# Patient Record
Sex: Male | Born: 1935 | Race: White | Hispanic: No | Marital: Single | Smoking: Former smoker
Health system: Southern US, Community
[De-identification: ages and names within clinical notes are randomized; demographics above are authoritative.]

## PROBLEM LIST (undated history)

## (undated) DIAGNOSIS — E785 Hyperlipidemia, unspecified: Secondary | ICD-10-CM

## (undated) DIAGNOSIS — M109 Gout, unspecified: Secondary | ICD-10-CM

## (undated) DIAGNOSIS — M1990 Unspecified osteoarthritis, unspecified site: Secondary | ICD-10-CM

## (undated) DIAGNOSIS — I10 Essential (primary) hypertension: Secondary | ICD-10-CM

## (undated) DIAGNOSIS — E119 Type 2 diabetes mellitus without complications: Secondary | ICD-10-CM

## (undated) HISTORY — DX: Essential (primary) hypertension: I10

## (undated) HISTORY — DX: Hyperlipidemia, unspecified: E78.5

## (undated) HISTORY — DX: Gout, unspecified: M10.9

## (undated) HISTORY — PX: BACK SURGERY: SHX140

## (undated) HISTORY — DX: Type 2 diabetes mellitus without complications: E11.9

## (undated) HISTORY — DX: Unspecified osteoarthritis, unspecified site: M19.90

## (undated) HISTORY — PX: MELANOMA EXCISION: SHX5266

---

## 2001-02-27 ENCOUNTER — Encounter: Payer: Self-pay | Admitting: Emergency Medicine

## 2001-02-27 ENCOUNTER — Emergency Department (HOSPITAL_COMMUNITY): Admission: EM | Admit: 2001-02-27 | Discharge: 2001-02-27 | Payer: Self-pay | Admitting: Emergency Medicine

## 2001-03-05 ENCOUNTER — Encounter: Payer: Self-pay | Admitting: Internal Medicine

## 2001-03-05 ENCOUNTER — Ambulatory Visit (HOSPITAL_COMMUNITY): Admission: RE | Admit: 2001-03-05 | Discharge: 2001-03-05 | Payer: Self-pay | Admitting: Internal Medicine

## 2004-12-19 ENCOUNTER — Ambulatory Visit (HOSPITAL_COMMUNITY): Admission: RE | Admit: 2004-12-19 | Discharge: 2004-12-19 | Payer: Self-pay | Admitting: Urology

## 2006-06-25 ENCOUNTER — Ambulatory Visit (HOSPITAL_COMMUNITY): Admission: RE | Admit: 2006-06-25 | Discharge: 2006-06-25 | Payer: Self-pay | Admitting: General Surgery

## 2007-12-07 ENCOUNTER — Ambulatory Visit (HOSPITAL_COMMUNITY): Admission: RE | Admit: 2007-12-07 | Discharge: 2007-12-07 | Payer: Self-pay | Admitting: Internal Medicine

## 2010-05-09 ENCOUNTER — Ambulatory Visit (HOSPITAL_COMMUNITY): Admission: RE | Admit: 2010-05-09 | Discharge: 2010-05-09 | Payer: Self-pay | Admitting: Internal Medicine

## 2010-12-01 ENCOUNTER — Encounter: Payer: Self-pay | Admitting: Internal Medicine

## 2011-03-28 NOTE — H&P (Signed)
NAME:  VALIN, MASSIE NO.:  [id number]   MEDICAL RECORD NO.:  [id number]          PATIENT TYPE:  PAMB   LOCATION:  DAY                           FACILITY:  [facility]   PHYSICIAN:  Dalia Heading, M.D.  DATE OF BIRTH:  06/04/1934   DATE OF ADMISSION:  DATE OF DISCHARGE:  LH                                HISTORY & PHYSICAL   CHIEF COMPLAINT:  Need for screening colonoscopy.   HISTORY OF PRESENT ILLNESS:  This patient is a 77 year old white male who is  referred for funduscopic evaluation.  He needs a colonoscopy for screening  purposes.  No abdominal pain, weight loss, nausea, vomiting, diarrhea,  constipation, melena, hematochezia have been noted.  He has never had a  colonoscopy.  There is no family history of colon carcinoma.   PAST MEDICAL HISTORY:  Includes gout.   PAST SURGICAL HISTORY:  Laminectomy, umbilical herniorrhaphy.   CURRENT MEDICATIONS:  Allopurinol.   ALLERGIES:  NO KNOWN DRUG ALLERGIES.   REVIEW OF SYSTEMS:  Noncontributory.   PHYSICAL EXAMINATION:  GENERAL:  Patient is a well-developed, well-nourished  white male in no acute distress.  LUNGS:  Clear to auscultation with equal breath sounds bilaterally.  HEART:  Examination reveals a regular rate and rhythm without S3, S4, or  murmurs.  ABDOMEN:  Soft, nontender, nondistended.  RECTAL:  Deferred due to procedure.   IMPRESSION:  Need for screening colonoscopy .   PLAN:  The patient is scheduled for a colonoscopy on 06/25/2006.  The risks  and benefits of the procedure including bleeding and perforation were fully  explained to the patient, gave informed consent.      Dalia Heading, M.D.  Electronically Signed     MAJ/MEDQ  D:  06/18/2006  T:  06/18/2006  Job:  [id number]   cc:   Kingsley Callander. Ouida Sills, MD  Fax: [phone]   Cristi Loron, M.D.  Fax: [phone]

## 2011-03-28 NOTE — Op Note (Signed)
NAME:  SHAHEEN, MENDE              ACCOUNT NO.:  [id number]   MEDICAL RECORD NO.:  [id number]          PATIENT TYPE:  AMB   LOCATION:  DAY                           FACILITY:  [facility]   PHYSICIAN:  Dennie Maizes, M.D.   DATE OF BIRTH:  1934-06-15   DATE OF PROCEDURE:  12/19/2004  DATE OF DISCHARGE:                                 OPERATIVE REPORT   PREOPERATIVE DIAGNOSIS:  Recurrent balanitis, phimosis.   POSTOPERATIVE DIAGNOSIS:  Recurrent balanitis, phimosis.   OPERATIVE PROCEDURE:  Circumcision.   ANESTHESIA:  General.   SURGEON:  Dr. Rito Ehrlich.   COMPLICATIONS:  None.   ESTIMATED BLOOD LOSS:  Minimal.   SPECIMEN:  Foreskin.   INDICATIONS FOR PROCEDURE:  This 77 year old male has a history of recurrent  balanitis with phimosis. He was taken to the OR today for circumcision under  anesthesia.   DESCRIPTION OF PROCEDURE:  General anesthesia was induced, and the patient  was placed on the OR table in the supine position. The lower abdomen and  genitalia were prepped and draped in a sterile fashion. The foreskin was  then clamped in 6 and 12 o'clock positions with straight hemostats. Dorsal  and ventral slits were made and two lateral skin flaps were raised. The  redundant foreskin was excised. The bleeding was controlled with  cauterization.  The edges of the foreskin were then approximated using 4-0 chromic gut.  Vaseline gauze dressing and Coban were applied to the penis. The estimated  blood loss was minimal. The patient was transferred to the PACU in  satisfactory condition.      SK/MEDQ  D:  12/19/2004  T:  12/19/2004  Job:  [id number]   cc:   Kingsley Callander. Ouida Sills, MD  [address]  [place]  [place] [address]  Fax: [phone]

## 2011-03-28 NOTE — H&P (Signed)
NAME:  Dustin Foster, Dustin Foster              ACCOUNT NO.:  [id number]   MEDICAL RECORD NO.:  [id number]          PATIENT TYPE:  AMB   LOCATION:  DAY                           FACILITY:  [facility]   PHYSICIAN:  Dennie Maizes, M.D.   DATE OF BIRTH:  1934-06-30   DATE OF ADMISSION:  12/19/2004  DATE OF DISCHARGE:  LH                                HISTORY & PHYSICAL   CHIEF COMPLAINT:  Recurrent inflammations of the foreskin, redness of the  foreskin and tight foreskin.   HISTORY OF PRESENT ILLNESS:  This 77 year old male was referred to me by Dr.  Ouida Sills.  He had been having recurrent episodes of inflammation of the  foreskin and erythema.  The foreskin is tight.  After retraction of the  foreskin and after sexual intercourse, he has cracking of the foreskin.  He  has used topical antifungal ointments.  He has had recurrence of the problem  for several months.  He denied having any voiding difficulty, dysuria or  gross hematuria.  There is no past history of urolithiasis.  He has urinary  frequency x 4-5 and nocturia x 2.   PAST MEDICAL HISTORY:  1.  History of hypertension.  2.  Borderline diabetes mellitus.  3.  Gout.   There is no evidence of an cardiac disease.   PAST SURGICAL HISTORY:  1.  Status post lumbar laminectomies x 2.  2.  Status post umbilical hernia repair.   MEDICATIONS:  1.  Lisinopril 40 mg one p.o. daily.  2.  Allopurinol 300 mg one p.o. daily.   ALLERGIES:  None.   FAMILY HISTORY:  Unremarkable.   PHYSICAL EXAMINATION:  VITAL SIGNS:  Temperature 98.1 degrees Fahrenheit.  HEIGHT:  5 feet 11 inches.  WEIGHT:  265 pounds.  HEENT:  Normal.  NECK:  No masses.  LUNGS:  Clear to auscultation.  HEART:  Regular rate and rhythm.  No murmurs.  ABDOMEN:  Soft.  No palpable flank mass.  No CVA tenderness.  Bladder not  palpable.  GENITALIA:  Penis with recurrent balanitis with phimosis noted.  The testes  are normal.  RECTAL:  A 40 g size, benign prostate.   IMPRESSION:   Recurrent balanitis and phimosis.   PLAN:  I have discussed with the patient regarding the management options.  He is scheduled to undergo circumcision under anesthesia at the short stay  center.  I have discussed with him regarding the diagnosis, operative  details, alternative treatments, outcome and possible risks and  complications and he has agreed for the procedure to be done.      SK/MEDQ  D:  12/18/2004  T:  12/18/2004  Job:  [id number]   cc:   [facility]  Fax: [phone]   Kingsley Callander. Ouida Sills, MD  [address]  [place]  [place] [address]  Fax: [phone]

## 2011-10-30 ENCOUNTER — Ambulatory Visit (HOSPITAL_COMMUNITY)
Admission: RE | Admit: 2011-10-30 | Discharge: 2011-10-30 | Disposition: A | Discharge status: Home / Self Care | Payer: No Typology Code available for payment source | Source: Ambulatory Visit | Attending: Internal Medicine | Admitting: Internal Medicine

## 2011-10-30 ENCOUNTER — Other Ambulatory Visit (HOSPITAL_COMMUNITY): Payer: Self-pay | Admitting: Internal Medicine

## 2011-10-30 DIAGNOSIS — R52 Pain, unspecified: Secondary | ICD-10-CM

## 2011-10-30 DIAGNOSIS — M542 Cervicalgia: Secondary | ICD-10-CM | POA: Insufficient documentation

## 2011-11-25 DIAGNOSIS — D485 Neoplasm of uncertain behavior of skin: Secondary | ICD-10-CM | POA: Diagnosis not present

## 2011-11-25 DIAGNOSIS — D235 Other benign neoplasm of skin of trunk: Secondary | ICD-10-CM | POA: Diagnosis not present

## 2011-11-25 DIAGNOSIS — Z85820 Personal history of malignant melanoma of skin: Secondary | ICD-10-CM | POA: Diagnosis not present

## 2011-12-12 DIAGNOSIS — Z79899 Other long term (current) drug therapy: Secondary | ICD-10-CM | POA: Diagnosis not present

## 2011-12-12 DIAGNOSIS — M109 Gout, unspecified: Secondary | ICD-10-CM | POA: Diagnosis not present

## 2011-12-12 DIAGNOSIS — E119 Type 2 diabetes mellitus without complications: Secondary | ICD-10-CM | POA: Diagnosis not present

## 2011-12-12 DIAGNOSIS — I10 Essential (primary) hypertension: Secondary | ICD-10-CM | POA: Diagnosis not present

## 2011-12-19 DIAGNOSIS — I10 Essential (primary) hypertension: Secondary | ICD-10-CM | POA: Diagnosis not present

## 2011-12-19 DIAGNOSIS — R42 Dizziness and giddiness: Secondary | ICD-10-CM | POA: Diagnosis not present

## 2012-02-05 DIAGNOSIS — H2510 Age-related nuclear cataract, unspecified eye: Secondary | ICD-10-CM | POA: Diagnosis not present

## 2012-02-24 DIAGNOSIS — D485 Neoplasm of uncertain behavior of skin: Secondary | ICD-10-CM | POA: Diagnosis not present

## 2012-02-24 DIAGNOSIS — D2370 Other benign neoplasm of skin of unspecified lower limb, including hip: Secondary | ICD-10-CM | POA: Diagnosis not present

## 2012-02-24 DIAGNOSIS — Z85820 Personal history of malignant melanoma of skin: Secondary | ICD-10-CM | POA: Diagnosis not present

## 2012-02-24 DIAGNOSIS — D235 Other benign neoplasm of skin of trunk: Secondary | ICD-10-CM | POA: Diagnosis not present

## 2012-05-18 DIAGNOSIS — D235 Other benign neoplasm of skin of trunk: Secondary | ICD-10-CM | POA: Diagnosis not present

## 2012-05-18 DIAGNOSIS — L570 Actinic keratosis: Secondary | ICD-10-CM | POA: Diagnosis not present

## 2012-05-18 DIAGNOSIS — Z85820 Personal history of malignant melanoma of skin: Secondary | ICD-10-CM | POA: Diagnosis not present

## 2012-05-29 IMAGING — CR DG CERVICAL SPINE COMPLETE 4+V
6 series · 6 of 6 positions shown · non-contrast
Comparison: 05/09/2010.

CLINICAL DATA: 77-year-old male with pain.

CERVICAL SPINE - COMPLETE 4+ VIEW

[view not recorded (1 of 6)]
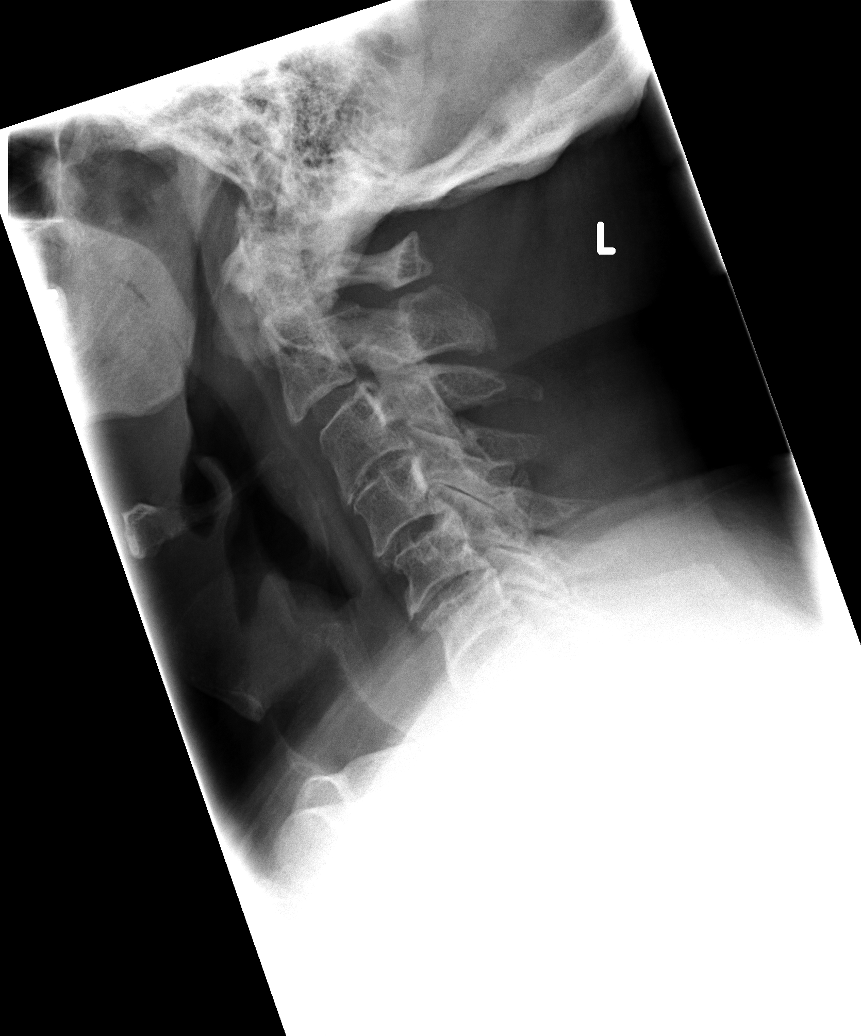

[view not recorded (2 of 6)]
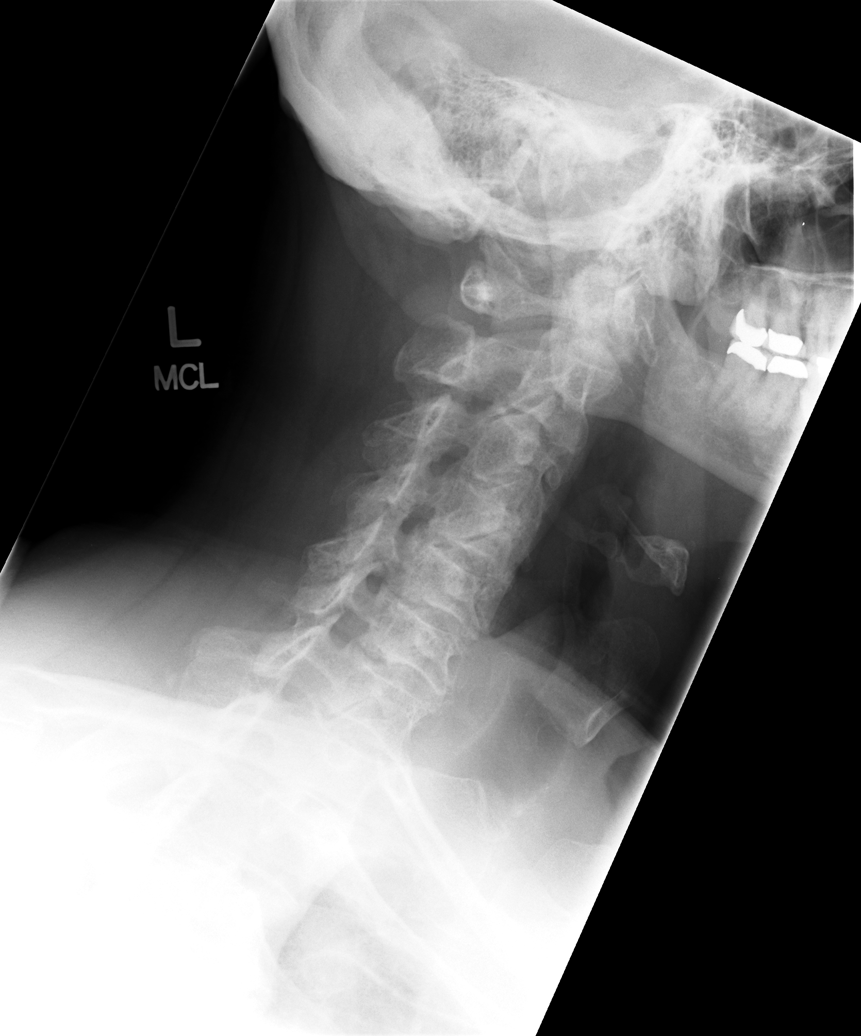

[view not recorded (3 of 6)]
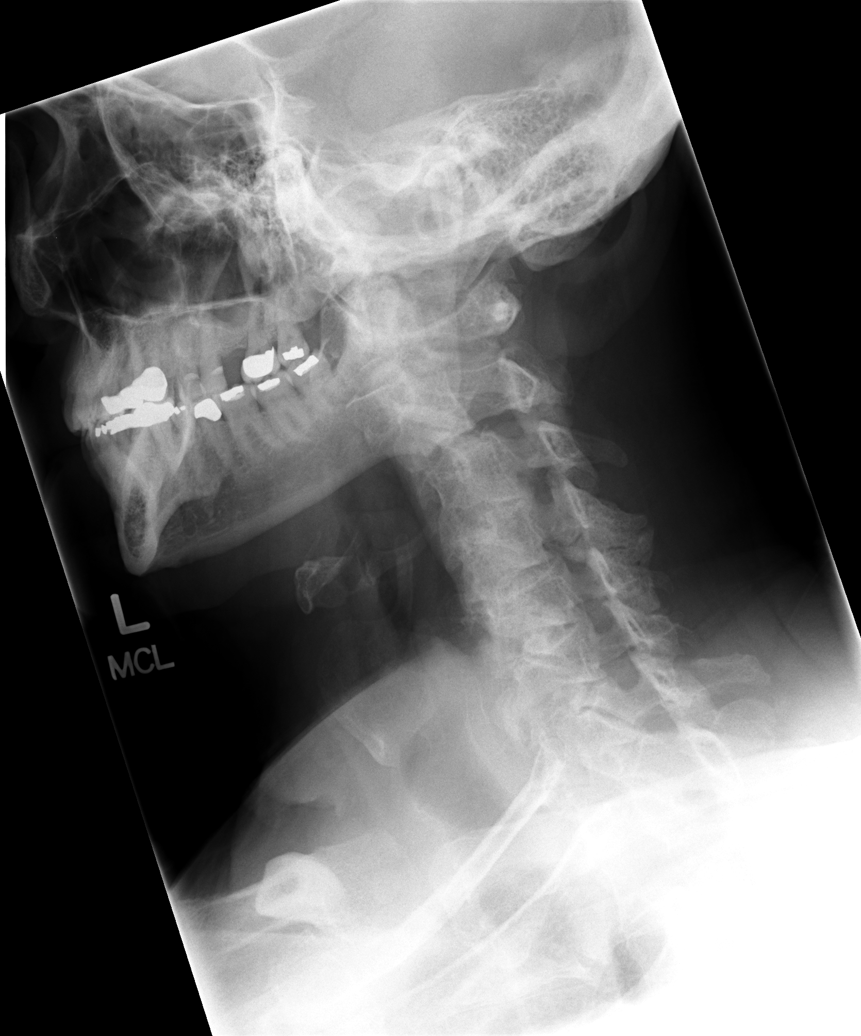

[view not recorded (4 of 6)]
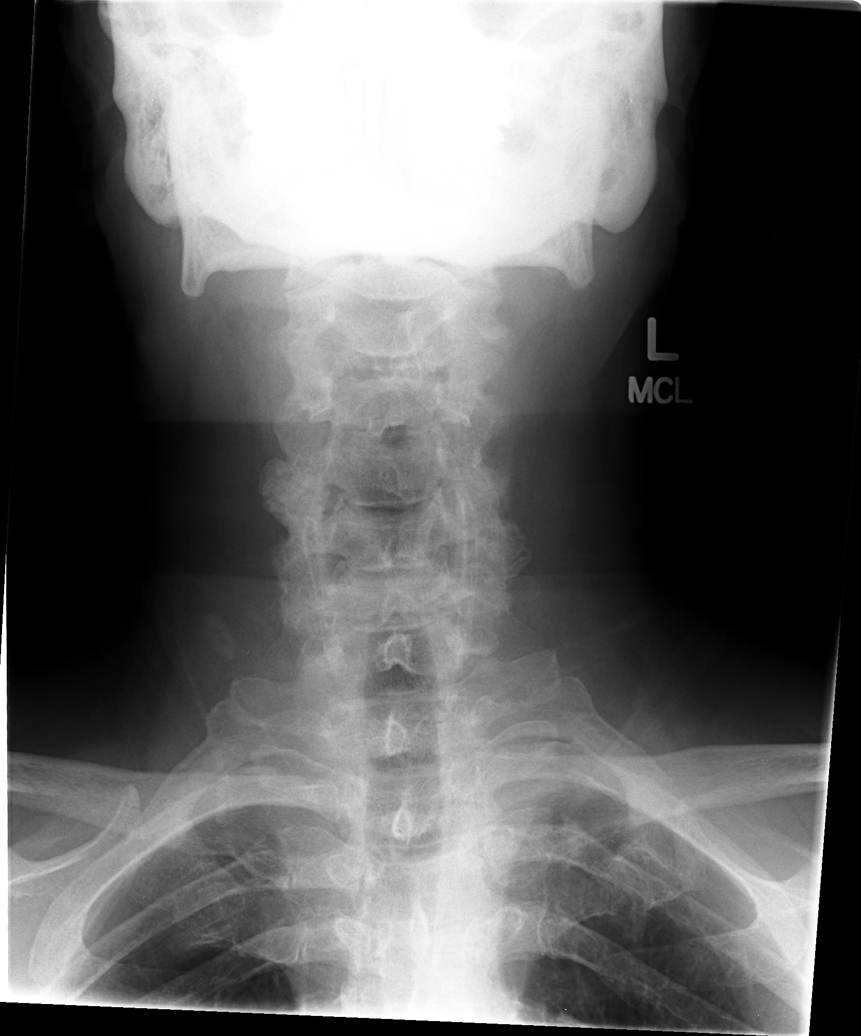

[view not recorded (5 of 6)]
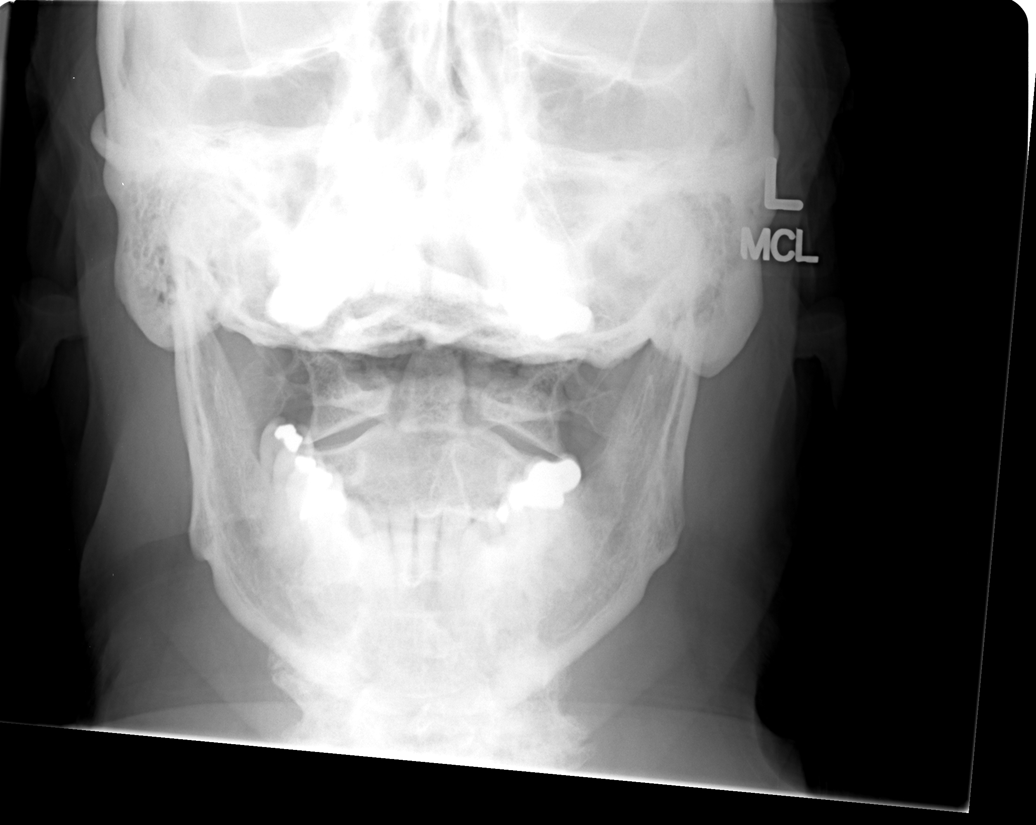

[view not recorded (6 of 6)]
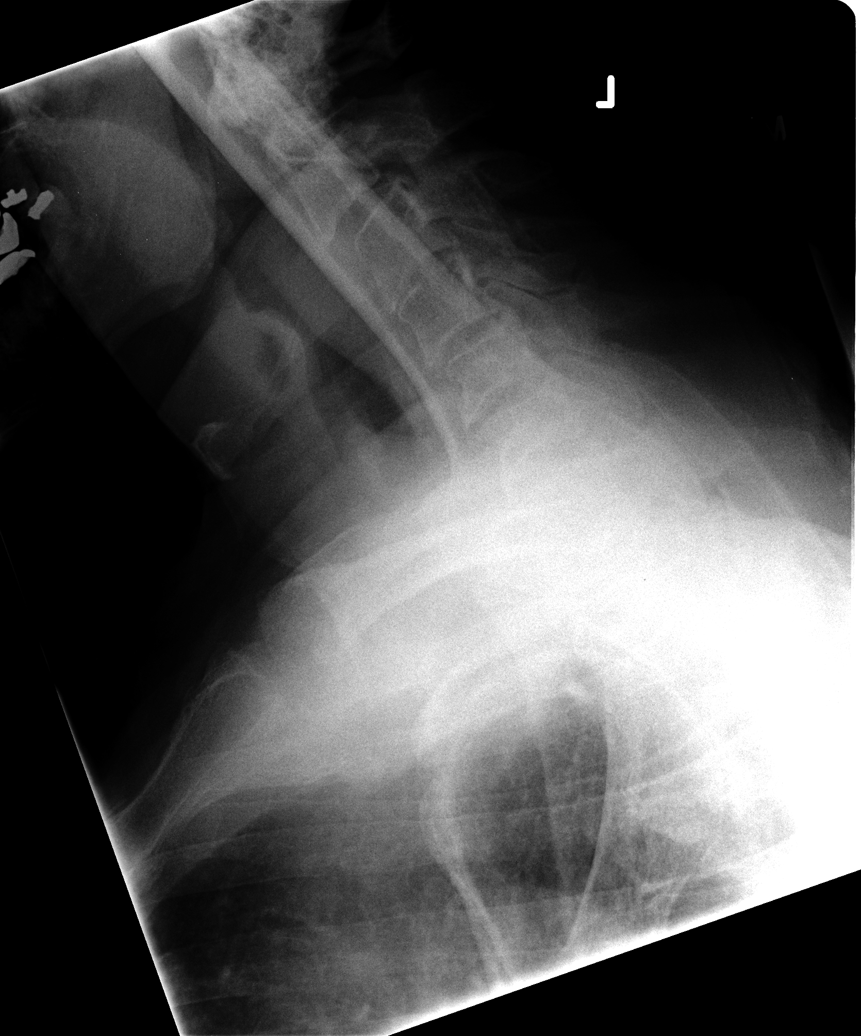

[6 of 6 positions shown; findings below may reference images not displayed]

FINDINGS: Straightening of cervical lordosis is chronic.  Normal
prevertebral soft tissues. Bilateral posterior element alignment is
within normal limits.  Severe facet hypertrophy at C4-C5 and C5-C6.
Severe osseous neural foraminal stenosis at the left C5 neural
foramen. Cervicothoracic junction alignment is within normal
limits.  Lung apices are within normal limits.  C1 and C2 alignment
within normal limits.
IMPRESSION: Chronic cervical degenerative changes, especially severe facet
hypertrophy at C4-C5 and C5-C6.

## 2012-06-21 DIAGNOSIS — R7301 Impaired fasting glucose: Secondary | ICD-10-CM | POA: Diagnosis not present

## 2012-06-21 DIAGNOSIS — I10 Essential (primary) hypertension: Secondary | ICD-10-CM | POA: Diagnosis not present

## 2012-08-16 DIAGNOSIS — D235 Other benign neoplasm of skin of trunk: Secondary | ICD-10-CM | POA: Diagnosis not present

## 2012-08-16 DIAGNOSIS — Z85820 Personal history of malignant melanoma of skin: Secondary | ICD-10-CM | POA: Diagnosis not present

## 2012-08-16 DIAGNOSIS — L570 Actinic keratosis: Secondary | ICD-10-CM | POA: Diagnosis not present

## 2012-08-16 DIAGNOSIS — L821 Other seborrheic keratosis: Secondary | ICD-10-CM | POA: Diagnosis not present

## 2012-08-30 DIAGNOSIS — Z23 Encounter for immunization: Secondary | ICD-10-CM | POA: Diagnosis not present

## 2012-11-16 DIAGNOSIS — D235 Other benign neoplasm of skin of trunk: Secondary | ICD-10-CM | POA: Diagnosis not present

## 2012-11-16 DIAGNOSIS — Z85820 Personal history of malignant melanoma of skin: Secondary | ICD-10-CM | POA: Diagnosis not present

## 2012-11-16 DIAGNOSIS — D485 Neoplasm of uncertain behavior of skin: Secondary | ICD-10-CM | POA: Diagnosis not present

## 2012-11-23 DIAGNOSIS — D485 Neoplasm of uncertain behavior of skin: Secondary | ICD-10-CM | POA: Diagnosis not present

## 2012-12-14 DIAGNOSIS — I10 Essential (primary) hypertension: Secondary | ICD-10-CM | POA: Diagnosis not present

## 2012-12-14 DIAGNOSIS — E785 Hyperlipidemia, unspecified: Secondary | ICD-10-CM | POA: Diagnosis not present

## 2012-12-14 DIAGNOSIS — Z79899 Other long term (current) drug therapy: Secondary | ICD-10-CM | POA: Diagnosis not present

## 2012-12-22 DIAGNOSIS — E119 Type 2 diabetes mellitus without complications: Secondary | ICD-10-CM | POA: Diagnosis not present

## 2012-12-22 DIAGNOSIS — I10 Essential (primary) hypertension: Secondary | ICD-10-CM | POA: Diagnosis not present

## 2013-02-08 DIAGNOSIS — H2510 Age-related nuclear cataract, unspecified eye: Secondary | ICD-10-CM | POA: Diagnosis not present

## 2013-02-14 DIAGNOSIS — L821 Other seborrheic keratosis: Secondary | ICD-10-CM | POA: Diagnosis not present

## 2013-02-14 DIAGNOSIS — Z85820 Personal history of malignant melanoma of skin: Secondary | ICD-10-CM | POA: Diagnosis not present

## 2013-02-14 DIAGNOSIS — D235 Other benign neoplasm of skin of trunk: Secondary | ICD-10-CM | POA: Diagnosis not present

## 2013-02-14 DIAGNOSIS — D485 Neoplasm of uncertain behavior of skin: Secondary | ICD-10-CM | POA: Diagnosis not present

## 2013-02-16 DIAGNOSIS — H2510 Age-related nuclear cataract, unspecified eye: Secondary | ICD-10-CM | POA: Diagnosis not present

## 2013-02-21 DIAGNOSIS — H2510 Age-related nuclear cataract, unspecified eye: Secondary | ICD-10-CM | POA: Diagnosis not present

## 2013-02-21 DIAGNOSIS — H25049 Posterior subcapsular polar age-related cataract, unspecified eye: Secondary | ICD-10-CM | POA: Diagnosis not present

## 2013-05-16 DIAGNOSIS — H2510 Age-related nuclear cataract, unspecified eye: Secondary | ICD-10-CM | POA: Diagnosis not present

## 2013-05-16 DIAGNOSIS — H25049 Posterior subcapsular polar age-related cataract, unspecified eye: Secondary | ICD-10-CM | POA: Diagnosis not present

## 2013-05-23 DIAGNOSIS — I781 Nevus, non-neoplastic: Secondary | ICD-10-CM | POA: Diagnosis not present

## 2013-05-23 DIAGNOSIS — Z85820 Personal history of malignant melanoma of skin: Secondary | ICD-10-CM | POA: Diagnosis not present

## 2013-05-23 DIAGNOSIS — D235 Other benign neoplasm of skin of trunk: Secondary | ICD-10-CM | POA: Diagnosis not present

## 2013-05-23 DIAGNOSIS — L821 Other seborrheic keratosis: Secondary | ICD-10-CM | POA: Diagnosis not present

## 2013-06-15 DIAGNOSIS — E119 Type 2 diabetes mellitus without complications: Secondary | ICD-10-CM | POA: Diagnosis not present

## 2013-06-23 DIAGNOSIS — E119 Type 2 diabetes mellitus without complications: Secondary | ICD-10-CM | POA: Diagnosis not present

## 2013-06-23 DIAGNOSIS — I10 Essential (primary) hypertension: Secondary | ICD-10-CM | POA: Diagnosis not present

## 2013-08-16 DIAGNOSIS — Z23 Encounter for immunization: Secondary | ICD-10-CM | POA: Diagnosis not present

## 2013-08-23 DIAGNOSIS — Z85820 Personal history of malignant melanoma of skin: Secondary | ICD-10-CM | POA: Diagnosis not present

## 2013-08-23 DIAGNOSIS — D235 Other benign neoplasm of skin of trunk: Secondary | ICD-10-CM | POA: Diagnosis not present

## 2013-12-30 DIAGNOSIS — E119 Type 2 diabetes mellitus without complications: Secondary | ICD-10-CM | POA: Diagnosis not present

## 2013-12-30 DIAGNOSIS — Z79899 Other long term (current) drug therapy: Secondary | ICD-10-CM | POA: Diagnosis not present

## 2013-12-30 DIAGNOSIS — E785 Hyperlipidemia, unspecified: Secondary | ICD-10-CM | POA: Diagnosis not present

## 2013-12-30 DIAGNOSIS — I10 Essential (primary) hypertension: Secondary | ICD-10-CM | POA: Diagnosis not present

## 2014-01-02 DIAGNOSIS — I10 Essential (primary) hypertension: Secondary | ICD-10-CM | POA: Diagnosis not present

## 2014-01-02 DIAGNOSIS — E119 Type 2 diabetes mellitus without complications: Secondary | ICD-10-CM | POA: Diagnosis not present

## 2014-02-21 DIAGNOSIS — D485 Neoplasm of uncertain behavior of skin: Secondary | ICD-10-CM | POA: Diagnosis not present

## 2014-02-21 DIAGNOSIS — D235 Other benign neoplasm of skin of trunk: Secondary | ICD-10-CM | POA: Diagnosis not present

## 2014-02-21 DIAGNOSIS — L820 Inflamed seborrheic keratosis: Secondary | ICD-10-CM | POA: Diagnosis not present

## 2014-02-21 DIAGNOSIS — Z85820 Personal history of malignant melanoma of skin: Secondary | ICD-10-CM | POA: Diagnosis not present

## 2014-06-29 DIAGNOSIS — E119 Type 2 diabetes mellitus without complications: Secondary | ICD-10-CM | POA: Diagnosis not present

## 2014-06-29 DIAGNOSIS — Z79899 Other long term (current) drug therapy: Secondary | ICD-10-CM | POA: Diagnosis not present

## 2014-06-29 DIAGNOSIS — M109 Gout, unspecified: Secondary | ICD-10-CM | POA: Diagnosis not present

## 2014-07-06 DIAGNOSIS — I10 Essential (primary) hypertension: Secondary | ICD-10-CM | POA: Diagnosis not present

## 2014-07-06 DIAGNOSIS — E119 Type 2 diabetes mellitus without complications: Secondary | ICD-10-CM | POA: Diagnosis not present

## 2014-08-22 DIAGNOSIS — Z961 Presence of intraocular lens: Secondary | ICD-10-CM | POA: Diagnosis not present

## 2014-08-22 DIAGNOSIS — H26492 Other secondary cataract, left eye: Secondary | ICD-10-CM | POA: Diagnosis not present

## 2014-08-23 DIAGNOSIS — L570 Actinic keratosis: Secondary | ICD-10-CM | POA: Diagnosis not present

## 2014-08-23 DIAGNOSIS — X32XXXD Exposure to sunlight, subsequent encounter: Secondary | ICD-10-CM | POA: Diagnosis not present

## 2014-08-23 DIAGNOSIS — Z08 Encounter for follow-up examination after completed treatment for malignant neoplasm: Secondary | ICD-10-CM | POA: Diagnosis not present

## 2014-08-23 DIAGNOSIS — Z85820 Personal history of malignant melanoma of skin: Secondary | ICD-10-CM | POA: Diagnosis not present

## 2014-08-23 DIAGNOSIS — L905 Scar conditions and fibrosis of skin: Secondary | ICD-10-CM | POA: Diagnosis not present

## 2014-08-23 DIAGNOSIS — Z1283 Encounter for screening for malignant neoplasm of skin: Secondary | ICD-10-CM | POA: Diagnosis not present

## 2014-08-24 DIAGNOSIS — Z23 Encounter for immunization: Secondary | ICD-10-CM | POA: Diagnosis not present

## 2015-01-01 DIAGNOSIS — E119 Type 2 diabetes mellitus without complications: Secondary | ICD-10-CM | POA: Diagnosis not present

## 2015-01-01 DIAGNOSIS — Z79899 Other long term (current) drug therapy: Secondary | ICD-10-CM | POA: Diagnosis not present

## 2015-01-01 DIAGNOSIS — E785 Hyperlipidemia, unspecified: Secondary | ICD-10-CM | POA: Diagnosis not present

## 2015-01-01 DIAGNOSIS — I10 Essential (primary) hypertension: Secondary | ICD-10-CM | POA: Diagnosis not present

## 2015-01-08 DIAGNOSIS — I10 Essential (primary) hypertension: Secondary | ICD-10-CM | POA: Diagnosis not present

## 2015-01-08 DIAGNOSIS — Z23 Encounter for immunization: Secondary | ICD-10-CM | POA: Diagnosis not present

## 2015-01-08 DIAGNOSIS — E119 Type 2 diabetes mellitus without complications: Secondary | ICD-10-CM | POA: Diagnosis not present

## 2015-02-21 DIAGNOSIS — Z1283 Encounter for screening for malignant neoplasm of skin: Secondary | ICD-10-CM | POA: Diagnosis not present

## 2015-02-21 DIAGNOSIS — Z08 Encounter for follow-up examination after completed treatment for malignant neoplasm: Secondary | ICD-10-CM | POA: Diagnosis not present

## 2015-02-21 DIAGNOSIS — B070 Plantar wart: Secondary | ICD-10-CM | POA: Diagnosis not present

## 2015-02-21 DIAGNOSIS — Z85820 Personal history of malignant melanoma of skin: Secondary | ICD-10-CM | POA: Diagnosis not present

## 2015-07-02 DIAGNOSIS — E119 Type 2 diabetes mellitus without complications: Secondary | ICD-10-CM | POA: Diagnosis not present

## 2015-07-10 DIAGNOSIS — Z6837 Body mass index (BMI) 37.0-37.9, adult: Secondary | ICD-10-CM | POA: Diagnosis not present

## 2015-07-10 DIAGNOSIS — I10 Essential (primary) hypertension: Secondary | ICD-10-CM | POA: Diagnosis not present

## 2015-07-10 DIAGNOSIS — E119 Type 2 diabetes mellitus without complications: Secondary | ICD-10-CM | POA: Diagnosis not present

## 2015-08-22 DIAGNOSIS — Z85820 Personal history of malignant melanoma of skin: Secondary | ICD-10-CM | POA: Diagnosis not present

## 2015-08-22 DIAGNOSIS — Z08 Encounter for follow-up examination after completed treatment for malignant neoplasm: Secondary | ICD-10-CM | POA: Diagnosis not present

## 2015-08-22 DIAGNOSIS — Z1283 Encounter for screening for malignant neoplasm of skin: Secondary | ICD-10-CM | POA: Diagnosis not present

## 2015-08-22 DIAGNOSIS — Z23 Encounter for immunization: Secondary | ICD-10-CM | POA: Diagnosis not present

## 2015-12-31 DIAGNOSIS — I10 Essential (primary) hypertension: Secondary | ICD-10-CM | POA: Diagnosis not present

## 2016-01-02 DIAGNOSIS — M109 Gout, unspecified: Secondary | ICD-10-CM | POA: Diagnosis not present

## 2016-01-02 DIAGNOSIS — M1990 Unspecified osteoarthritis, unspecified site: Secondary | ICD-10-CM | POA: Diagnosis not present

## 2016-01-02 DIAGNOSIS — E119 Type 2 diabetes mellitus without complications: Secondary | ICD-10-CM | POA: Diagnosis not present

## 2016-01-02 DIAGNOSIS — E785 Hyperlipidemia, unspecified: Secondary | ICD-10-CM | POA: Diagnosis not present

## 2016-01-02 DIAGNOSIS — Z79899 Other long term (current) drug therapy: Secondary | ICD-10-CM | POA: Diagnosis not present

## 2016-01-10 DIAGNOSIS — Z6839 Body mass index (BMI) 39.0-39.9, adult: Secondary | ICD-10-CM | POA: Diagnosis not present

## 2016-01-10 DIAGNOSIS — E119 Type 2 diabetes mellitus without complications: Secondary | ICD-10-CM | POA: Diagnosis not present

## 2016-01-10 DIAGNOSIS — M1000 Idiopathic gout, unspecified site: Secondary | ICD-10-CM | POA: Diagnosis not present

## 2016-01-10 DIAGNOSIS — I10 Essential (primary) hypertension: Secondary | ICD-10-CM | POA: Diagnosis not present

## 2016-02-20 DIAGNOSIS — Z08 Encounter for follow-up examination after completed treatment for malignant neoplasm: Secondary | ICD-10-CM | POA: Diagnosis not present

## 2016-02-20 DIAGNOSIS — X32XXXD Exposure to sunlight, subsequent encounter: Secondary | ICD-10-CM | POA: Diagnosis not present

## 2016-02-20 DIAGNOSIS — Z85820 Personal history of malignant melanoma of skin: Secondary | ICD-10-CM | POA: Diagnosis not present

## 2016-02-20 DIAGNOSIS — Z1283 Encounter for screening for malignant neoplasm of skin: Secondary | ICD-10-CM | POA: Diagnosis not present

## 2016-02-20 DIAGNOSIS — L570 Actinic keratosis: Secondary | ICD-10-CM | POA: Diagnosis not present

## 2016-07-07 DIAGNOSIS — E119 Type 2 diabetes mellitus without complications: Secondary | ICD-10-CM | POA: Diagnosis not present

## 2016-07-15 DIAGNOSIS — I10 Essential (primary) hypertension: Secondary | ICD-10-CM | POA: Diagnosis not present

## 2016-07-15 DIAGNOSIS — E119 Type 2 diabetes mellitus without complications: Secondary | ICD-10-CM | POA: Diagnosis not present

## 2016-08-20 DIAGNOSIS — Z23 Encounter for immunization: Secondary | ICD-10-CM | POA: Diagnosis not present

## 2016-08-20 DIAGNOSIS — Z85820 Personal history of malignant melanoma of skin: Secondary | ICD-10-CM | POA: Diagnosis not present

## 2016-08-20 DIAGNOSIS — D225 Melanocytic nevi of trunk: Secondary | ICD-10-CM | POA: Diagnosis not present

## 2016-08-20 DIAGNOSIS — Z1283 Encounter for screening for malignant neoplasm of skin: Secondary | ICD-10-CM | POA: Diagnosis not present

## 2016-08-20 DIAGNOSIS — Z08 Encounter for follow-up examination after completed treatment for malignant neoplasm: Secondary | ICD-10-CM | POA: Diagnosis not present

## 2016-08-26 DIAGNOSIS — H04123 Dry eye syndrome of bilateral lacrimal glands: Secondary | ICD-10-CM | POA: Diagnosis not present

## 2016-08-26 DIAGNOSIS — H02831 Dermatochalasis of right upper eyelid: Secondary | ICD-10-CM | POA: Diagnosis not present

## 2016-08-26 DIAGNOSIS — Z961 Presence of intraocular lens: Secondary | ICD-10-CM | POA: Diagnosis not present

## 2016-08-26 DIAGNOSIS — H02834 Dermatochalasis of left upper eyelid: Secondary | ICD-10-CM | POA: Diagnosis not present

## 2016-08-26 DIAGNOSIS — D3131 Benign neoplasm of right choroid: Secondary | ICD-10-CM | POA: Diagnosis not present

## 2016-08-26 DIAGNOSIS — E119 Type 2 diabetes mellitus without complications: Secondary | ICD-10-CM | POA: Diagnosis not present

## 2016-11-25 DIAGNOSIS — Z6836 Body mass index (BMI) 36.0-36.9, adult: Secondary | ICD-10-CM | POA: Diagnosis not present

## 2016-11-25 DIAGNOSIS — J189 Pneumonia, unspecified organism: Secondary | ICD-10-CM | POA: Diagnosis not present

## 2017-01-12 DIAGNOSIS — I10 Essential (primary) hypertension: Secondary | ICD-10-CM | POA: Diagnosis not present

## 2017-01-12 DIAGNOSIS — M1990 Unspecified osteoarthritis, unspecified site: Secondary | ICD-10-CM | POA: Diagnosis not present

## 2017-01-12 DIAGNOSIS — E785 Hyperlipidemia, unspecified: Secondary | ICD-10-CM | POA: Diagnosis not present

## 2017-01-12 DIAGNOSIS — Z79899 Other long term (current) drug therapy: Secondary | ICD-10-CM | POA: Diagnosis not present

## 2017-01-12 DIAGNOSIS — E119 Type 2 diabetes mellitus without complications: Secondary | ICD-10-CM | POA: Diagnosis not present

## 2017-01-20 DIAGNOSIS — M1A00X0 Idiopathic chronic gout, unspecified site, without tophus (tophi): Secondary | ICD-10-CM | POA: Diagnosis not present

## 2017-01-20 DIAGNOSIS — I10 Essential (primary) hypertension: Secondary | ICD-10-CM | POA: Diagnosis not present

## 2017-01-20 DIAGNOSIS — E119 Type 2 diabetes mellitus without complications: Secondary | ICD-10-CM | POA: Diagnosis not present

## 2017-01-21 DIAGNOSIS — Z87891 Personal history of nicotine dependence: Secondary | ICD-10-CM | POA: Diagnosis not present

## 2017-01-21 DIAGNOSIS — Z6836 Body mass index (BMI) 36.0-36.9, adult: Secondary | ICD-10-CM | POA: Diagnosis not present

## 2017-01-21 DIAGNOSIS — Z7982 Long term (current) use of aspirin: Secondary | ICD-10-CM | POA: Diagnosis not present

## 2017-01-21 DIAGNOSIS — I10 Essential (primary) hypertension: Secondary | ICD-10-CM | POA: Diagnosis not present

## 2017-01-21 DIAGNOSIS — Z79899 Other long term (current) drug therapy: Secondary | ICD-10-CM | POA: Diagnosis not present

## 2017-01-21 DIAGNOSIS — E119 Type 2 diabetes mellitus without complications: Secondary | ICD-10-CM | POA: Diagnosis not present

## 2017-01-21 DIAGNOSIS — Z0000 Encounter for general adult medical examination without abnormal findings: Secondary | ICD-10-CM | POA: Diagnosis not present

## 2017-01-21 DIAGNOSIS — E669 Obesity, unspecified: Secondary | ICD-10-CM | POA: Diagnosis not present

## 2017-01-21 DIAGNOSIS — M1A9XX0 Chronic gout, unspecified, without tophus (tophi): Secondary | ICD-10-CM | POA: Diagnosis not present

## 2017-07-16 DIAGNOSIS — E119 Type 2 diabetes mellitus without complications: Secondary | ICD-10-CM | POA: Diagnosis not present

## 2017-07-23 DIAGNOSIS — I10 Essential (primary) hypertension: Secondary | ICD-10-CM | POA: Diagnosis not present

## 2017-07-23 DIAGNOSIS — E119 Type 2 diabetes mellitus without complications: Secondary | ICD-10-CM | POA: Diagnosis not present

## 2017-08-24 DIAGNOSIS — Z1283 Encounter for screening for malignant neoplasm of skin: Secondary | ICD-10-CM | POA: Diagnosis not present

## 2017-08-24 DIAGNOSIS — Z85820 Personal history of malignant melanoma of skin: Secondary | ICD-10-CM | POA: Diagnosis not present

## 2017-08-24 DIAGNOSIS — Z08 Encounter for follow-up examination after completed treatment for malignant neoplasm: Secondary | ICD-10-CM | POA: Diagnosis not present

## 2017-09-02 DIAGNOSIS — D3131 Benign neoplasm of right choroid: Secondary | ICD-10-CM | POA: Diagnosis not present

## 2017-09-02 DIAGNOSIS — Z961 Presence of intraocular lens: Secondary | ICD-10-CM | POA: Diagnosis not present

## 2017-09-02 DIAGNOSIS — Z23 Encounter for immunization: Secondary | ICD-10-CM | POA: Diagnosis not present

## 2017-09-02 DIAGNOSIS — H02834 Dermatochalasis of left upper eyelid: Secondary | ICD-10-CM | POA: Diagnosis not present

## 2017-09-02 DIAGNOSIS — H04123 Dry eye syndrome of bilateral lacrimal glands: Secondary | ICD-10-CM | POA: Diagnosis not present

## 2017-09-02 DIAGNOSIS — E119 Type 2 diabetes mellitus without complications: Secondary | ICD-10-CM | POA: Diagnosis not present

## 2017-09-02 DIAGNOSIS — H02831 Dermatochalasis of right upper eyelid: Secondary | ICD-10-CM | POA: Diagnosis not present

## 2017-10-05 DIAGNOSIS — R69 Illness, unspecified: Secondary | ICD-10-CM | POA: Diagnosis not present

## 2017-11-17 DIAGNOSIS — Z23 Encounter for immunization: Secondary | ICD-10-CM | POA: Diagnosis not present

## 2017-11-17 DIAGNOSIS — W540XXA Bitten by dog, initial encounter: Secondary | ICD-10-CM | POA: Diagnosis not present

## 2018-01-21 DIAGNOSIS — Z79899 Other long term (current) drug therapy: Secondary | ICD-10-CM | POA: Diagnosis not present

## 2018-01-21 DIAGNOSIS — M109 Gout, unspecified: Secondary | ICD-10-CM | POA: Diagnosis not present

## 2018-01-21 DIAGNOSIS — I10 Essential (primary) hypertension: Secondary | ICD-10-CM | POA: Diagnosis not present

## 2018-01-21 DIAGNOSIS — E785 Hyperlipidemia, unspecified: Secondary | ICD-10-CM | POA: Diagnosis not present

## 2018-01-21 DIAGNOSIS — E119 Type 2 diabetes mellitus without complications: Secondary | ICD-10-CM | POA: Diagnosis not present

## 2018-01-28 DIAGNOSIS — I10 Essential (primary) hypertension: Secondary | ICD-10-CM | POA: Diagnosis not present

## 2018-01-28 DIAGNOSIS — E119 Type 2 diabetes mellitus without complications: Secondary | ICD-10-CM | POA: Diagnosis not present

## 2018-01-28 DIAGNOSIS — M109 Gout, unspecified: Secondary | ICD-10-CM | POA: Diagnosis not present

## 2018-01-28 DIAGNOSIS — Z6838 Body mass index (BMI) 38.0-38.9, adult: Secondary | ICD-10-CM | POA: Diagnosis not present

## 2018-03-31 DIAGNOSIS — R69 Illness, unspecified: Secondary | ICD-10-CM | POA: Diagnosis not present

## 2018-07-22 DIAGNOSIS — E119 Type 2 diabetes mellitus without complications: Secondary | ICD-10-CM | POA: Diagnosis not present

## 2018-07-29 DIAGNOSIS — N401 Enlarged prostate with lower urinary tract symptoms: Secondary | ICD-10-CM | POA: Diagnosis not present

## 2018-07-29 DIAGNOSIS — I10 Essential (primary) hypertension: Secondary | ICD-10-CM | POA: Diagnosis not present

## 2018-07-29 DIAGNOSIS — E1159 Type 2 diabetes mellitus with other circulatory complications: Secondary | ICD-10-CM | POA: Diagnosis not present

## 2018-09-02 DIAGNOSIS — H02834 Dermatochalasis of left upper eyelid: Secondary | ICD-10-CM | POA: Diagnosis not present

## 2018-09-02 DIAGNOSIS — D3131 Benign neoplasm of right choroid: Secondary | ICD-10-CM | POA: Diagnosis not present

## 2018-09-02 DIAGNOSIS — H04123 Dry eye syndrome of bilateral lacrimal glands: Secondary | ICD-10-CM | POA: Diagnosis not present

## 2018-09-02 DIAGNOSIS — H02831 Dermatochalasis of right upper eyelid: Secondary | ICD-10-CM | POA: Diagnosis not present

## 2018-09-02 DIAGNOSIS — Z961 Presence of intraocular lens: Secondary | ICD-10-CM | POA: Diagnosis not present

## 2018-09-02 DIAGNOSIS — E119 Type 2 diabetes mellitus without complications: Secondary | ICD-10-CM | POA: Diagnosis not present

## 2018-09-14 DIAGNOSIS — Z85820 Personal history of malignant melanoma of skin: Secondary | ICD-10-CM | POA: Diagnosis not present

## 2018-09-14 DIAGNOSIS — Z23 Encounter for immunization: Secondary | ICD-10-CM | POA: Diagnosis not present

## 2018-09-14 DIAGNOSIS — Z1283 Encounter for screening for malignant neoplasm of skin: Secondary | ICD-10-CM | POA: Diagnosis not present

## 2018-09-14 DIAGNOSIS — Z08 Encounter for follow-up examination after completed treatment for malignant neoplasm: Secondary | ICD-10-CM | POA: Diagnosis not present

## 2018-09-16 DIAGNOSIS — R69 Illness, unspecified: Secondary | ICD-10-CM | POA: Diagnosis not present

## 2018-12-14 DIAGNOSIS — E119 Type 2 diabetes mellitus without complications: Secondary | ICD-10-CM | POA: Diagnosis not present

## 2018-12-14 DIAGNOSIS — M79674 Pain in right toe(s): Secondary | ICD-10-CM | POA: Diagnosis not present

## 2018-12-14 DIAGNOSIS — M79675 Pain in left toe(s): Secondary | ICD-10-CM | POA: Diagnosis not present

## 2018-12-14 DIAGNOSIS — B351 Tinea unguium: Secondary | ICD-10-CM | POA: Diagnosis not present

## 2018-12-14 DIAGNOSIS — M79676 Pain in unspecified toe(s): Secondary | ICD-10-CM | POA: Diagnosis not present

## 2019-01-20 DIAGNOSIS — E1159 Type 2 diabetes mellitus with other circulatory complications: Secondary | ICD-10-CM | POA: Diagnosis not present

## 2019-01-20 DIAGNOSIS — N400 Enlarged prostate without lower urinary tract symptoms: Secondary | ICD-10-CM | POA: Diagnosis not present

## 2019-01-20 DIAGNOSIS — Z79899 Other long term (current) drug therapy: Secondary | ICD-10-CM | POA: Diagnosis not present

## 2019-01-20 DIAGNOSIS — M109 Gout, unspecified: Secondary | ICD-10-CM | POA: Diagnosis not present

## 2019-01-20 DIAGNOSIS — I10 Essential (primary) hypertension: Secondary | ICD-10-CM | POA: Diagnosis not present

## 2019-01-27 DIAGNOSIS — M1000 Idiopathic gout, unspecified site: Secondary | ICD-10-CM | POA: Diagnosis not present

## 2019-01-27 DIAGNOSIS — E1122 Type 2 diabetes mellitus with diabetic chronic kidney disease: Secondary | ICD-10-CM | POA: Diagnosis not present

## 2019-01-27 DIAGNOSIS — Z6838 Body mass index (BMI) 38.0-38.9, adult: Secondary | ICD-10-CM | POA: Diagnosis not present

## 2019-01-27 DIAGNOSIS — N183 Chronic kidney disease, stage 3 (moderate): Secondary | ICD-10-CM | POA: Diagnosis not present

## 2019-01-27 DIAGNOSIS — I10 Essential (primary) hypertension: Secondary | ICD-10-CM | POA: Diagnosis not present

## 2019-03-29 DIAGNOSIS — M79675 Pain in left toe(s): Secondary | ICD-10-CM | POA: Diagnosis not present

## 2019-03-29 DIAGNOSIS — M79674 Pain in right toe(s): Secondary | ICD-10-CM | POA: Diagnosis not present

## 2019-03-29 DIAGNOSIS — M79676 Pain in unspecified toe(s): Secondary | ICD-10-CM | POA: Diagnosis not present

## 2019-03-29 DIAGNOSIS — B351 Tinea unguium: Secondary | ICD-10-CM | POA: Diagnosis not present

## 2019-05-10 DIAGNOSIS — R69 Illness, unspecified: Secondary | ICD-10-CM | POA: Diagnosis not present

## 2019-06-21 DIAGNOSIS — B351 Tinea unguium: Secondary | ICD-10-CM | POA: Diagnosis not present

## 2019-06-21 DIAGNOSIS — M79675 Pain in left toe(s): Secondary | ICD-10-CM | POA: Diagnosis not present

## 2019-06-21 DIAGNOSIS — M79674 Pain in right toe(s): Secondary | ICD-10-CM | POA: Diagnosis not present

## 2019-07-25 DIAGNOSIS — N183 Chronic kidney disease, stage 3 (moderate): Secondary | ICD-10-CM | POA: Diagnosis not present

## 2019-07-25 DIAGNOSIS — I10 Essential (primary) hypertension: Secondary | ICD-10-CM | POA: Diagnosis not present

## 2019-07-25 DIAGNOSIS — E1129 Type 2 diabetes mellitus with other diabetic kidney complication: Secondary | ICD-10-CM | POA: Diagnosis not present

## 2019-07-25 DIAGNOSIS — E785 Hyperlipidemia, unspecified: Secondary | ICD-10-CM | POA: Diagnosis not present

## 2019-07-25 DIAGNOSIS — M109 Gout, unspecified: Secondary | ICD-10-CM | POA: Diagnosis not present

## 2019-07-25 DIAGNOSIS — Z79899 Other long term (current) drug therapy: Secondary | ICD-10-CM | POA: Diagnosis not present

## 2019-08-01 DIAGNOSIS — G9009 Other idiopathic peripheral autonomic neuropathy: Secondary | ICD-10-CM | POA: Diagnosis not present

## 2019-08-01 DIAGNOSIS — I10 Essential (primary) hypertension: Secondary | ICD-10-CM | POA: Diagnosis not present

## 2019-08-01 DIAGNOSIS — E1122 Type 2 diabetes mellitus with diabetic chronic kidney disease: Secondary | ICD-10-CM | POA: Diagnosis not present

## 2019-08-01 DIAGNOSIS — N183 Chronic kidney disease, stage 3 (moderate): Secondary | ICD-10-CM | POA: Diagnosis not present

## 2019-08-22 DIAGNOSIS — Z23 Encounter for immunization: Secondary | ICD-10-CM | POA: Diagnosis not present

## 2019-09-06 DIAGNOSIS — D3131 Benign neoplasm of right choroid: Secondary | ICD-10-CM | POA: Diagnosis not present

## 2019-09-06 DIAGNOSIS — Z961 Presence of intraocular lens: Secondary | ICD-10-CM | POA: Diagnosis not present

## 2019-09-06 DIAGNOSIS — H02831 Dermatochalasis of right upper eyelid: Secondary | ICD-10-CM | POA: Diagnosis not present

## 2019-09-06 DIAGNOSIS — E119 Type 2 diabetes mellitus without complications: Secondary | ICD-10-CM | POA: Diagnosis not present

## 2019-09-06 DIAGNOSIS — H04123 Dry eye syndrome of bilateral lacrimal glands: Secondary | ICD-10-CM | POA: Diagnosis not present

## 2019-09-06 DIAGNOSIS — H02834 Dermatochalasis of left upper eyelid: Secondary | ICD-10-CM | POA: Diagnosis not present

## 2019-09-20 DIAGNOSIS — Z85820 Personal history of malignant melanoma of skin: Secondary | ICD-10-CM | POA: Diagnosis not present

## 2019-09-20 DIAGNOSIS — M79674 Pain in right toe(s): Secondary | ICD-10-CM | POA: Diagnosis not present

## 2019-09-20 DIAGNOSIS — M79675 Pain in left toe(s): Secondary | ICD-10-CM | POA: Diagnosis not present

## 2019-09-20 DIAGNOSIS — B351 Tinea unguium: Secondary | ICD-10-CM | POA: Diagnosis not present

## 2019-09-20 DIAGNOSIS — M79676 Pain in unspecified toe(s): Secondary | ICD-10-CM | POA: Diagnosis not present

## 2019-09-20 DIAGNOSIS — Z08 Encounter for follow-up examination after completed treatment for malignant neoplasm: Secondary | ICD-10-CM | POA: Diagnosis not present

## 2019-09-20 DIAGNOSIS — Z1283 Encounter for screening for malignant neoplasm of skin: Secondary | ICD-10-CM | POA: Diagnosis not present

## 2019-09-20 DIAGNOSIS — L820 Inflamed seborrheic keratosis: Secondary | ICD-10-CM | POA: Diagnosis not present

## 2019-11-16 DIAGNOSIS — R69 Illness, unspecified: Secondary | ICD-10-CM | POA: Diagnosis not present

## 2019-12-13 DIAGNOSIS — B351 Tinea unguium: Secondary | ICD-10-CM | POA: Diagnosis not present

## 2019-12-13 DIAGNOSIS — M79676 Pain in unspecified toe(s): Secondary | ICD-10-CM | POA: Diagnosis not present

## 2019-12-13 DIAGNOSIS — E1151 Type 2 diabetes mellitus with diabetic peripheral angiopathy without gangrene: Secondary | ICD-10-CM | POA: Diagnosis not present

## 2020-01-23 DIAGNOSIS — Z79899 Other long term (current) drug therapy: Secondary | ICD-10-CM | POA: Diagnosis not present

## 2020-01-23 DIAGNOSIS — E785 Hyperlipidemia, unspecified: Secondary | ICD-10-CM | POA: Diagnosis not present

## 2020-01-23 DIAGNOSIS — E1129 Type 2 diabetes mellitus with other diabetic kidney complication: Secondary | ICD-10-CM | POA: Diagnosis not present

## 2020-01-23 DIAGNOSIS — M109 Gout, unspecified: Secondary | ICD-10-CM | POA: Diagnosis not present

## 2020-01-23 DIAGNOSIS — I10 Essential (primary) hypertension: Secondary | ICD-10-CM | POA: Diagnosis not present

## 2020-01-23 DIAGNOSIS — N183 Chronic kidney disease, stage 3 unspecified: Secondary | ICD-10-CM | POA: Diagnosis not present

## 2020-01-23 DIAGNOSIS — G629 Polyneuropathy, unspecified: Secondary | ICD-10-CM | POA: Diagnosis not present

## 2020-01-30 DIAGNOSIS — E1122 Type 2 diabetes mellitus with diabetic chronic kidney disease: Secondary | ICD-10-CM | POA: Diagnosis not present

## 2020-01-30 DIAGNOSIS — N1831 Chronic kidney disease, stage 3a: Secondary | ICD-10-CM | POA: Diagnosis not present

## 2020-01-30 DIAGNOSIS — E875 Hyperkalemia: Secondary | ICD-10-CM | POA: Diagnosis not present

## 2020-02-28 DIAGNOSIS — M79674 Pain in right toe(s): Secondary | ICD-10-CM | POA: Diagnosis not present

## 2020-02-28 DIAGNOSIS — M79676 Pain in unspecified toe(s): Secondary | ICD-10-CM | POA: Diagnosis not present

## 2020-02-28 DIAGNOSIS — M79675 Pain in left toe(s): Secondary | ICD-10-CM | POA: Diagnosis not present

## 2020-02-28 DIAGNOSIS — B351 Tinea unguium: Secondary | ICD-10-CM | POA: Diagnosis not present

## 2020-03-01 DIAGNOSIS — E875 Hyperkalemia: Secondary | ICD-10-CM | POA: Diagnosis not present

## 2020-03-01 DIAGNOSIS — I10 Essential (primary) hypertension: Secondary | ICD-10-CM | POA: Diagnosis not present

## 2020-05-01 DIAGNOSIS — M79676 Pain in unspecified toe(s): Secondary | ICD-10-CM | POA: Diagnosis not present

## 2020-05-01 DIAGNOSIS — M79675 Pain in left toe(s): Secondary | ICD-10-CM | POA: Diagnosis not present

## 2020-05-01 DIAGNOSIS — M79674 Pain in right toe(s): Secondary | ICD-10-CM | POA: Diagnosis not present

## 2020-05-01 DIAGNOSIS — B351 Tinea unguium: Secondary | ICD-10-CM | POA: Diagnosis not present

## 2020-05-16 DIAGNOSIS — R69 Illness, unspecified: Secondary | ICD-10-CM | POA: Diagnosis not present

## 2020-07-10 DIAGNOSIS — B351 Tinea unguium: Secondary | ICD-10-CM | POA: Diagnosis not present

## 2020-07-10 DIAGNOSIS — M79676 Pain in unspecified toe(s): Secondary | ICD-10-CM | POA: Diagnosis not present

## 2020-07-25 DIAGNOSIS — Z79899 Other long term (current) drug therapy: Secondary | ICD-10-CM | POA: Diagnosis not present

## 2020-07-25 DIAGNOSIS — E875 Hyperkalemia: Secondary | ICD-10-CM | POA: Diagnosis not present

## 2020-07-25 DIAGNOSIS — I10 Essential (primary) hypertension: Secondary | ICD-10-CM | POA: Diagnosis not present

## 2020-07-25 DIAGNOSIS — E1129 Type 2 diabetes mellitus with other diabetic kidney complication: Secondary | ICD-10-CM | POA: Diagnosis not present

## 2020-07-25 DIAGNOSIS — N183 Chronic kidney disease, stage 3 unspecified: Secondary | ICD-10-CM | POA: Diagnosis not present

## 2020-08-01 DIAGNOSIS — I10 Essential (primary) hypertension: Secondary | ICD-10-CM | POA: Diagnosis not present

## 2020-08-01 DIAGNOSIS — N1831 Chronic kidney disease, stage 3a: Secondary | ICD-10-CM | POA: Diagnosis not present

## 2020-08-01 DIAGNOSIS — E1122 Type 2 diabetes mellitus with diabetic chronic kidney disease: Secondary | ICD-10-CM | POA: Diagnosis not present

## 2020-08-01 DIAGNOSIS — R7309 Other abnormal glucose: Secondary | ICD-10-CM | POA: Diagnosis not present

## 2020-08-01 DIAGNOSIS — Z23 Encounter for immunization: Secondary | ICD-10-CM | POA: Diagnosis not present

## 2020-09-06 DIAGNOSIS — Z961 Presence of intraocular lens: Secondary | ICD-10-CM | POA: Diagnosis not present

## 2020-09-06 DIAGNOSIS — H04123 Dry eye syndrome of bilateral lacrimal glands: Secondary | ICD-10-CM | POA: Diagnosis not present

## 2020-09-06 DIAGNOSIS — D3131 Benign neoplasm of right choroid: Secondary | ICD-10-CM | POA: Diagnosis not present

## 2020-09-06 DIAGNOSIS — H02831 Dermatochalasis of right upper eyelid: Secondary | ICD-10-CM | POA: Diagnosis not present

## 2020-09-06 DIAGNOSIS — H02834 Dermatochalasis of left upper eyelid: Secondary | ICD-10-CM | POA: Diagnosis not present

## 2020-09-06 DIAGNOSIS — E119 Type 2 diabetes mellitus without complications: Secondary | ICD-10-CM | POA: Diagnosis not present

## 2020-09-18 DIAGNOSIS — M7742 Metatarsalgia, left foot: Secondary | ICD-10-CM | POA: Diagnosis not present

## 2020-09-18 DIAGNOSIS — B351 Tinea unguium: Secondary | ICD-10-CM | POA: Diagnosis not present

## 2020-09-18 DIAGNOSIS — M79672 Pain in left foot: Secondary | ICD-10-CM | POA: Diagnosis not present

## 2020-09-19 DIAGNOSIS — Z08 Encounter for follow-up examination after completed treatment for malignant neoplasm: Secondary | ICD-10-CM | POA: Diagnosis not present

## 2020-09-19 DIAGNOSIS — Z1283 Encounter for screening for malignant neoplasm of skin: Secondary | ICD-10-CM | POA: Diagnosis not present

## 2020-09-19 DIAGNOSIS — D225 Melanocytic nevi of trunk: Secondary | ICD-10-CM | POA: Diagnosis not present

## 2020-09-19 DIAGNOSIS — B078 Other viral warts: Secondary | ICD-10-CM | POA: Diagnosis not present

## 2020-09-19 DIAGNOSIS — Z85828 Personal history of other malignant neoplasm of skin: Secondary | ICD-10-CM | POA: Diagnosis not present

## 2020-09-19 DIAGNOSIS — Z85820 Personal history of malignant melanoma of skin: Secondary | ICD-10-CM | POA: Diagnosis not present

## 2020-11-29 DIAGNOSIS — B351 Tinea unguium: Secondary | ICD-10-CM | POA: Diagnosis not present

## 2020-11-29 DIAGNOSIS — M79676 Pain in unspecified toe(s): Secondary | ICD-10-CM | POA: Diagnosis not present

## 2021-01-22 DIAGNOSIS — E1129 Type 2 diabetes mellitus with other diabetic kidney complication: Secondary | ICD-10-CM | POA: Diagnosis not present

## 2021-01-22 DIAGNOSIS — N183 Chronic kidney disease, stage 3 unspecified: Secondary | ICD-10-CM | POA: Diagnosis not present

## 2021-01-22 DIAGNOSIS — Z79899 Other long term (current) drug therapy: Secondary | ICD-10-CM | POA: Diagnosis not present

## 2021-01-22 DIAGNOSIS — M109 Gout, unspecified: Secondary | ICD-10-CM | POA: Diagnosis not present

## 2021-01-22 DIAGNOSIS — I10 Essential (primary) hypertension: Secondary | ICD-10-CM | POA: Diagnosis not present

## 2021-01-29 DIAGNOSIS — I10 Essential (primary) hypertension: Secondary | ICD-10-CM | POA: Diagnosis not present

## 2021-01-29 DIAGNOSIS — R7309 Other abnormal glucose: Secondary | ICD-10-CM | POA: Diagnosis not present

## 2021-01-29 DIAGNOSIS — M109 Gout, unspecified: Secondary | ICD-10-CM | POA: Diagnosis not present

## 2021-01-29 DIAGNOSIS — N1831 Chronic kidney disease, stage 3a: Secondary | ICD-10-CM | POA: Diagnosis not present

## 2021-01-29 DIAGNOSIS — E1122 Type 2 diabetes mellitus with diabetic chronic kidney disease: Secondary | ICD-10-CM | POA: Diagnosis not present

## 2021-02-12 DIAGNOSIS — M79676 Pain in unspecified toe(s): Secondary | ICD-10-CM | POA: Diagnosis not present

## 2021-02-12 DIAGNOSIS — B351 Tinea unguium: Secondary | ICD-10-CM | POA: Diagnosis not present

## 2021-04-23 DIAGNOSIS — M79676 Pain in unspecified toe(s): Secondary | ICD-10-CM | POA: Diagnosis not present

## 2021-04-23 DIAGNOSIS — B351 Tinea unguium: Secondary | ICD-10-CM | POA: Diagnosis not present

## 2021-07-02 DIAGNOSIS — B351 Tinea unguium: Secondary | ICD-10-CM | POA: Diagnosis not present

## 2021-07-02 DIAGNOSIS — M79676 Pain in unspecified toe(s): Secondary | ICD-10-CM | POA: Diagnosis not present

## 2021-07-25 DIAGNOSIS — E1129 Type 2 diabetes mellitus with other diabetic kidney complication: Secondary | ICD-10-CM | POA: Diagnosis not present

## 2021-07-25 DIAGNOSIS — Z79899 Other long term (current) drug therapy: Secondary | ICD-10-CM | POA: Diagnosis not present

## 2021-07-25 DIAGNOSIS — I10 Essential (primary) hypertension: Secondary | ICD-10-CM | POA: Diagnosis not present

## 2021-07-25 DIAGNOSIS — M109 Gout, unspecified: Secondary | ICD-10-CM | POA: Diagnosis not present

## 2021-07-25 DIAGNOSIS — N1831 Chronic kidney disease, stage 3a: Secondary | ICD-10-CM | POA: Diagnosis not present

## 2021-07-25 DIAGNOSIS — E875 Hyperkalemia: Secondary | ICD-10-CM | POA: Diagnosis not present

## 2021-08-01 DIAGNOSIS — E1122 Type 2 diabetes mellitus with diabetic chronic kidney disease: Secondary | ICD-10-CM | POA: Diagnosis not present

## 2021-08-01 DIAGNOSIS — R7309 Other abnormal glucose: Secondary | ICD-10-CM | POA: Diagnosis not present

## 2021-08-01 DIAGNOSIS — I10 Essential (primary) hypertension: Secondary | ICD-10-CM | POA: Diagnosis not present

## 2021-08-01 DIAGNOSIS — Z23 Encounter for immunization: Secondary | ICD-10-CM | POA: Diagnosis not present

## 2021-08-01 DIAGNOSIS — E875 Hyperkalemia: Secondary | ICD-10-CM | POA: Diagnosis not present

## 2021-09-10 DIAGNOSIS — M79676 Pain in unspecified toe(s): Secondary | ICD-10-CM | POA: Diagnosis not present

## 2021-09-10 DIAGNOSIS — B351 Tinea unguium: Secondary | ICD-10-CM | POA: Diagnosis not present

## 2021-09-19 DIAGNOSIS — Z961 Presence of intraocular lens: Secondary | ICD-10-CM | POA: Diagnosis not present

## 2021-09-19 DIAGNOSIS — H02831 Dermatochalasis of right upper eyelid: Secondary | ICD-10-CM | POA: Diagnosis not present

## 2021-09-19 DIAGNOSIS — H04123 Dry eye syndrome of bilateral lacrimal glands: Secondary | ICD-10-CM | POA: Diagnosis not present

## 2021-09-19 DIAGNOSIS — H02834 Dermatochalasis of left upper eyelid: Secondary | ICD-10-CM | POA: Diagnosis not present

## 2021-09-19 DIAGNOSIS — D3131 Benign neoplasm of right choroid: Secondary | ICD-10-CM | POA: Diagnosis not present

## 2021-09-19 DIAGNOSIS — E119 Type 2 diabetes mellitus without complications: Secondary | ICD-10-CM | POA: Diagnosis not present

## 2021-09-19 DIAGNOSIS — H43392 Other vitreous opacities, left eye: Secondary | ICD-10-CM | POA: Diagnosis not present

## 2021-09-24 DIAGNOSIS — Z08 Encounter for follow-up examination after completed treatment for malignant neoplasm: Secondary | ICD-10-CM | POA: Diagnosis not present

## 2021-09-24 DIAGNOSIS — Z1283 Encounter for screening for malignant neoplasm of skin: Secondary | ICD-10-CM | POA: Diagnosis not present

## 2021-09-24 DIAGNOSIS — Z85820 Personal history of malignant melanoma of skin: Secondary | ICD-10-CM | POA: Diagnosis not present

## 2021-11-08 DIAGNOSIS — I10 Essential (primary) hypertension: Secondary | ICD-10-CM | POA: Diagnosis not present

## 2021-11-08 DIAGNOSIS — E785 Hyperlipidemia, unspecified: Secondary | ICD-10-CM | POA: Diagnosis not present

## 2021-11-19 DIAGNOSIS — B351 Tinea unguium: Secondary | ICD-10-CM | POA: Diagnosis not present

## 2021-11-19 DIAGNOSIS — M79676 Pain in unspecified toe(s): Secondary | ICD-10-CM | POA: Diagnosis not present

## 2022-01-22 DIAGNOSIS — E1129 Type 2 diabetes mellitus with other diabetic kidney complication: Secondary | ICD-10-CM | POA: Diagnosis not present

## 2022-01-22 DIAGNOSIS — I10 Essential (primary) hypertension: Secondary | ICD-10-CM | POA: Diagnosis not present

## 2022-01-22 DIAGNOSIS — E875 Hyperkalemia: Secondary | ICD-10-CM | POA: Diagnosis not present

## 2022-01-22 DIAGNOSIS — Z79899 Other long term (current) drug therapy: Secondary | ICD-10-CM | POA: Diagnosis not present

## 2022-01-22 DIAGNOSIS — N1832 Chronic kidney disease, stage 3b: Secondary | ICD-10-CM | POA: Diagnosis not present

## 2022-01-28 DIAGNOSIS — B351 Tinea unguium: Secondary | ICD-10-CM | POA: Diagnosis not present

## 2022-01-28 DIAGNOSIS — M79676 Pain in unspecified toe(s): Secondary | ICD-10-CM | POA: Diagnosis not present

## 2022-01-29 DIAGNOSIS — N183 Chronic kidney disease, stage 3 unspecified: Secondary | ICD-10-CM | POA: Diagnosis not present

## 2022-01-29 DIAGNOSIS — R7309 Other abnormal glucose: Secondary | ICD-10-CM | POA: Diagnosis not present

## 2022-01-29 DIAGNOSIS — I10 Essential (primary) hypertension: Secondary | ICD-10-CM | POA: Diagnosis not present

## 2022-01-29 DIAGNOSIS — E1129 Type 2 diabetes mellitus with other diabetic kidney complication: Secondary | ICD-10-CM | POA: Diagnosis not present

## 2022-04-08 DIAGNOSIS — M79676 Pain in unspecified toe(s): Secondary | ICD-10-CM | POA: Diagnosis not present

## 2022-04-08 DIAGNOSIS — B351 Tinea unguium: Secondary | ICD-10-CM | POA: Diagnosis not present

## 2022-06-17 DIAGNOSIS — B351 Tinea unguium: Secondary | ICD-10-CM | POA: Diagnosis not present

## 2022-06-17 DIAGNOSIS — M79676 Pain in unspecified toe(s): Secondary | ICD-10-CM | POA: Diagnosis not present

## 2022-07-28 DIAGNOSIS — M109 Gout, unspecified: Secondary | ICD-10-CM | POA: Diagnosis not present

## 2022-07-28 DIAGNOSIS — E875 Hyperkalemia: Secondary | ICD-10-CM | POA: Diagnosis not present

## 2022-07-28 DIAGNOSIS — E1129 Type 2 diabetes mellitus with other diabetic kidney complication: Secondary | ICD-10-CM | POA: Diagnosis not present

## 2022-07-28 DIAGNOSIS — I10 Essential (primary) hypertension: Secondary | ICD-10-CM | POA: Diagnosis not present

## 2022-07-28 DIAGNOSIS — N1831 Chronic kidney disease, stage 3a: Secondary | ICD-10-CM | POA: Diagnosis not present

## 2022-07-28 DIAGNOSIS — Z79899 Other long term (current) drug therapy: Secondary | ICD-10-CM | POA: Diagnosis not present

## 2022-08-04 DIAGNOSIS — N400 Enlarged prostate without lower urinary tract symptoms: Secondary | ICD-10-CM | POA: Diagnosis not present

## 2022-08-04 DIAGNOSIS — R351 Nocturia: Secondary | ICD-10-CM | POA: Diagnosis not present

## 2022-08-04 DIAGNOSIS — I10 Essential (primary) hypertension: Secondary | ICD-10-CM | POA: Diagnosis not present

## 2022-08-04 DIAGNOSIS — Z23 Encounter for immunization: Secondary | ICD-10-CM | POA: Diagnosis not present

## 2022-08-04 DIAGNOSIS — N1831 Chronic kidney disease, stage 3a: Secondary | ICD-10-CM | POA: Diagnosis not present

## 2022-08-04 DIAGNOSIS — R350 Frequency of micturition: Secondary | ICD-10-CM | POA: Diagnosis not present

## 2022-08-04 DIAGNOSIS — E1122 Type 2 diabetes mellitus with diabetic chronic kidney disease: Secondary | ICD-10-CM | POA: Diagnosis not present

## 2022-08-04 DIAGNOSIS — R7309 Other abnormal glucose: Secondary | ICD-10-CM | POA: Diagnosis not present

## 2022-08-12 ENCOUNTER — Encounter: Payer: Self-pay | Admitting: Urology

## 2022-08-12 ENCOUNTER — Ambulatory Visit (INDEPENDENT_AMBULATORY_CARE_PROVIDER_SITE_OTHER): Payer: Medicare HMO | Admitting: Urology

## 2022-08-12 VITALS — BP 134/76 | HR 81 | Ht 71.0 in | Wt 254.0 lb

## 2022-08-12 DIAGNOSIS — N138 Other obstructive and reflux uropathy: Secondary | ICD-10-CM | POA: Diagnosis not present

## 2022-08-12 DIAGNOSIS — R350 Frequency of micturition: Secondary | ICD-10-CM | POA: Diagnosis not present

## 2022-08-12 DIAGNOSIS — N401 Enlarged prostate with lower urinary tract symptoms: Secondary | ICD-10-CM | POA: Diagnosis not present

## 2022-08-12 LAB — BLADDER SCAN AMB NON-IMAGING: Scan Result: 98

## 2022-08-12 MED ORDER — SILODOSIN 8 MG PO CAPS: 8.0000 mg | ORAL_CAPSULE | Freq: Every day | ORAL | 11 refills | Status: DC

## 2022-08-12 NOTE — Progress Notes (Signed)
post void residual =18m

## 2022-08-12 NOTE — Progress Notes (Signed)
Assessment: 1. BPH with obstruction/lower urinary tract symptoms   2. Urinary frequency    Plan: I reviewed the patient's records from Dr. Willey Blade including office notes and lab results. Recommend trial of silodosin 8 mg daily in place of tamsulosin. Discontinue tamsulosin Return to office in 1 month with bladder scan. Consider addition of Myrbetriq 25 mg daily if his lower urinary tract symptoms persist.  Chief Complaint:  Chief Complaint  Patient presents with   Benign Prostatic Hypertrophy    History of Present Illness:  Dustin Foster is a 88 y.o. male who is seen in consultation from Asencion Noble, MD for evaluation of BPH with lower urinary tract symptoms.  He has symptoms of frequency, urgency and nocturia 4-10 times.  He also reports occasional urge incontinence.  He voids with a weak stream and is not sure if he empties his bladder completely.  He was having dysuria which resolved following antibiotic therapy.  He has been on tamsulosin 0.8 mg daily.  No gross hematuria or flank pain. Urinalysis from 08/04/2022 showed >30 WBCs, 0-2 RBCs, many bacteria.  No urine culture performed.  He was treated with Augmentin x7 days.   Past Medical History:  Past Medical History:  Diagnosis Date   Diabetes ([place])    Gout    Hyperlipidemia    Hypertension    Osteoarthritis     Past Surgical History:  Past Surgical History:  Procedure Laterality Date   BACK SURGERY     MELANOMA EXCISION      Allergies:  No Known Allergies  Family History:  History reviewed. No pertinent family history.  Social History:  Social History   Tobacco Use   Smoking status: Former    Types: Cigarettes    Quit date: 1990    Years since quitting: 33.7   Smokeless tobacco: Never    Review of symptoms:  Constitutional:  Negative for unexplained weight loss, night sweats, fever, chills ENT:  Negative for nose bleeds, sinus pain, painful swallowing CV:  Negative for chest pain, shortness of  breath, exercise intolerance, palpitations, loss of consciousness Resp:  Negative for cough, wheezing, shortness of breath GI:  Negative for nausea, vomiting, diarrhea, bloody stools GU:  Positives noted in HPI; otherwise negative for gross hematuria Neuro:  Negative for seizures, poor balance, limb weakness, slurred speech Psych:  Negative for lack of energy, depression, anxiety Endocrine:  Negative for polydipsia, polyuria, symptoms of hypoglycemia (dizziness, hunger, sweating) Hematologic:  Negative for anemia, purpura, petechia, prolonged or excessive bleeding, use of anticoagulants  Allergic:  Negative for difficulty breathing or choking as a result of exposure to anything; no shellfish allergy; no allergic response (rash/itch) to materials, foods  Physical exam: BP 134/76   Pulse 81   Ht '5\' 11"'$  (1.803 m)   Wt 254 lb (115.2 kg)   BMI 35.43 kg/m  GENERAL APPEARANCE:  Well appearing, well developed, well nourished, NAD HEENT: Atraumatic, Normocephalic, oropharynx clear. NECK: Supple without lymphadenopathy or thyromegaly. LUNGS: Clear to auscultation bilaterally. HEART: Regular Rate and Rhythm without murmurs, gallops, or rubs. ABDOMEN: Soft, non-tender, No Masses. EXTREMITIES: Moves all extremities well.  Without clubbing, cyanosis, or edema. NEUROLOGIC:  Alert and oriented x 3, normal gait, CN II-XII grossly intact.  MENTAL STATUS:  Appropriate. BACK:  Non-tender to palpation.  No CVAT SKIN:  Warm, dry and intact.   GU: Penis:  circumcised Meatus: Normal Scrotum: normal, no masses Testis: normal without masses bilateral Prostate: 50 g, NT, no nodules Rectum: Normal tone,  no masses or tenderness   Results: U/A: Dipstick negative  PVR: 98 ml

## 2022-08-13 LAB — URINALYSIS, ROUTINE W REFLEX MICROSCOPIC
Bilirubin, UA: NEGATIVE
Glucose, UA: NEGATIVE
Ketones, UA: NEGATIVE
Leukocytes,UA: NEGATIVE
Nitrite, UA: NEGATIVE
Protein,UA: NEGATIVE
RBC, UA: NEGATIVE
Specific Gravity, UA: 1.015 (ref 1.005–1.030)
Urobilinogen, Ur: 0.2 mg/dL (ref 0.2–1.0)
pH, UA: 6.5 (ref 5.0–7.5)

## 2022-08-26 DIAGNOSIS — B351 Tinea unguium: Secondary | ICD-10-CM | POA: Diagnosis not present

## 2022-08-26 DIAGNOSIS — M79676 Pain in unspecified toe(s): Secondary | ICD-10-CM | POA: Diagnosis not present

## 2022-09-08 ENCOUNTER — Encounter: Payer: Self-pay | Admitting: Urology

## 2022-09-08 ENCOUNTER — Ambulatory Visit (INDEPENDENT_AMBULATORY_CARE_PROVIDER_SITE_OTHER): Payer: Medicare HMO | Admitting: Urology

## 2022-09-08 VITALS — BP 141/70 | HR 72 | Ht 71.0 in | Wt 254.0 lb

## 2022-09-08 DIAGNOSIS — R350 Frequency of micturition: Secondary | ICD-10-CM | POA: Diagnosis not present

## 2022-09-08 DIAGNOSIS — N138 Other obstructive and reflux uropathy: Secondary | ICD-10-CM

## 2022-09-08 DIAGNOSIS — R8290 Unspecified abnormal findings in urine: Secondary | ICD-10-CM | POA: Diagnosis not present

## 2022-09-08 DIAGNOSIS — N401 Enlarged prostate with lower urinary tract symptoms: Secondary | ICD-10-CM | POA: Diagnosis not present

## 2022-09-08 LAB — URINALYSIS, ROUTINE W REFLEX MICROSCOPIC
Bilirubin, UA: NEGATIVE
Glucose, UA: NEGATIVE
Ketones, UA: NEGATIVE
Nitrite, UA: POSITIVE — AB
Specific Gravity, UA: 1.02 (ref 1.005–1.030)
Urobilinogen, Ur: 0.2 mg/dL (ref 0.2–1.0)
pH, UA: 5.5 (ref 5.0–7.5)

## 2022-09-08 LAB — MICROSCOPIC EXAMINATION: WBC, UA: >30 /hpf — AB (ref 0–5)

## 2022-09-08 LAB — BLADDER SCAN AMB NON-IMAGING: Scan Result: 48

## 2022-09-08 MED ORDER — MIRABEGRON ER 25 MG PO TB24: 25.0000 mg | ORAL_TABLET | Freq: Every day | ORAL | 0 refills | Status: DC

## 2022-09-08 MED ORDER — SULFAMETHOXAZOLE-TRIMETHOPRIM 800-160 MG PO TABS: 1.0000 | ORAL_TABLET | Freq: Two times a day (BID) | ORAL | 0 refills | Status: DC

## 2022-09-08 NOTE — Progress Notes (Signed)
   Assessment: 1. BPH with obstruction/lower urinary tract symptoms   2. Urinary frequency   3. Abnormal urine findings     Plan: Urine culture sent today. Begin Bactrim DS BID x 7 days.  Rx sent. Continue silodosin 8 mg daily  Trial of Myrbetriq 25 mg daily - samples given Return to office in 1 month  Chief Complaint:  Chief Complaint  Patient presents with   Benign Prostatic Hypertrophy    History of Present Illness:  Dustin Foster is a 88 y.o. male who is seen for further evaluation of BPH with lower urinary tract symptoms.  At his initial visit in 10/23, he reported symptoms of frequency, urgency and nocturia 4-10 times.  He also reported occasional urge incontinence.  He was voiding with a weak stream and was not sure if he emptied his bladder completely.  He was having dysuria which resolved following antibiotic therapy.  He had been on tamsulosin 0.8 mg daily.  No gross hematuria or flank pain. Urinalysis from 08/04/2022 showed >30 WBCs, 0-2 RBCs, many bacteria.  No urine culture performed.  He was treated with Augmentin x7 days. PVR = 98 mL 10/23 He was changed to silodosin 8 mg daily in 10/23.  He returns today for follow-up.   He has not seen a significant change in his symptoms with the silodosin.  He continues with frequency, urgency, and nocturia.  He is having some dysuria as well.  No gross hematuria or flank pain.  Portions of the above documentation were copied from a prior visit for review purposes only.   Past Medical History:  Past Medical History:  Diagnosis Date   Diabetes ([place])    Gout    Hyperlipidemia    Hypertension    Osteoarthritis     Past Surgical History:  Past Surgical History:  Procedure Laterality Date   BACK SURGERY     MELANOMA EXCISION      Allergies:  No Known Allergies  Family History:  No family history on file.  Social History:  Social History   Tobacco Use   Smoking status: Former    Types: Cigarettes    Quit  date: 1990    Years since quitting: 33.8   Smokeless tobacco: Never    ROS: Constitutional:  Negative for fever, chills, weight loss CV: Negative for chest pain, previous MI, hypertension Respiratory:  Negative for shortness of breath, wheezing, sleep apnea, frequent cough GI:  Negative for nausea, vomiting, bloody stool, GERD  Physical exam: BP (!) 141/70   Pulse 72   Ht '5\' 11"'$  (1.803 m)   Wt 254 lb (115.2 kg)   BMI 35.43 kg/m  GENERAL APPEARANCE:  Well appearing, well developed, well nourished, NAD HEENT:  Atraumatic, normocephalic, oropharynx clear NECK:  Supple without lymphadenopathy or thyromegaly ABDOMEN:  Soft, non-tender, no masses EXTREMITIES:  Moves all extremities well, without clubbing, cyanosis, or edema NEUROLOGIC:  Alert and oriented x 3, normal gait, CN II-XII grossly intact MENTAL STATUS:  appropriate BACK:  Non-tender to palpation, No CVAT SKIN:  Warm, dry, and intact   Results: U/A: >30 WBCs, 0-2 RBCs, moderate bacteria, nitrite positive  PVR:  48 ml

## 2022-09-08 NOTE — Progress Notes (Signed)
post void residual =46m

## 2022-09-11 ENCOUNTER — Telehealth: Payer: Self-pay

## 2022-09-11 LAB — URINE CULTURE

## 2022-09-11 NOTE — Telephone Encounter (Signed)
Patient aware of MD response to his culture and voiced understanding.

## 2022-09-11 NOTE — Telephone Encounter (Signed)
-----   Message from Primus Bravo, MD sent at 09/11/2022  9:11 AM EDT ----- Please notify the patient that his urine culture did show evidence of UTI. The antibiotic that he was prescribed is appropriate treatment.  Recommend that he complete the Bactrim. Keep follow-up scheduled.

## 2022-09-24 DIAGNOSIS — Z1283 Encounter for screening for malignant neoplasm of skin: Secondary | ICD-10-CM | POA: Diagnosis not present

## 2022-09-24 DIAGNOSIS — Z85820 Personal history of malignant melanoma of skin: Secondary | ICD-10-CM | POA: Diagnosis not present

## 2022-09-24 DIAGNOSIS — D225 Melanocytic nevi of trunk: Secondary | ICD-10-CM | POA: Diagnosis not present

## 2022-09-24 DIAGNOSIS — Z08 Encounter for follow-up examination after completed treatment for malignant neoplasm: Secondary | ICD-10-CM | POA: Diagnosis not present

## 2022-09-24 DIAGNOSIS — L570 Actinic keratosis: Secondary | ICD-10-CM | POA: Diagnosis not present

## 2022-09-24 DIAGNOSIS — X32XXXD Exposure to sunlight, subsequent encounter: Secondary | ICD-10-CM | POA: Diagnosis not present

## 2022-10-08 ENCOUNTER — Encounter: Payer: Self-pay | Admitting: Urology

## 2022-10-08 ENCOUNTER — Ambulatory Visit (INDEPENDENT_AMBULATORY_CARE_PROVIDER_SITE_OTHER): Payer: Medicare HMO | Admitting: Urology

## 2022-10-08 VITALS — BP 140/73 | HR 67 | Ht 71.0 in | Wt 254.0 lb

## 2022-10-08 DIAGNOSIS — N138 Other obstructive and reflux uropathy: Secondary | ICD-10-CM

## 2022-10-08 DIAGNOSIS — N401 Enlarged prostate with lower urinary tract symptoms: Secondary | ICD-10-CM | POA: Diagnosis not present

## 2022-10-08 DIAGNOSIS — Z87440 Personal history of urinary (tract) infections: Secondary | ICD-10-CM

## 2022-10-08 DIAGNOSIS — R8290 Unspecified abnormal findings in urine: Secondary | ICD-10-CM

## 2022-10-08 DIAGNOSIS — R350 Frequency of micturition: Secondary | ICD-10-CM | POA: Diagnosis not present

## 2022-10-08 LAB — POCT URINALYSIS DIPSTICK
Bilirubin, UA: NEGATIVE
Glucose, UA: NEGATIVE
Nitrite, UA: POSITIVE
Protein, UA: POSITIVE — AB
Spec Grav, UA: 1.02 (ref 1.010–1.025)
Urobilinogen, UA: 0.2 E.U./dL
pH, UA: 5.5 (ref 5.0–8.0)

## 2022-10-08 LAB — BLADDER SCAN AMB NON-IMAGING: Scan Result: 85

## 2022-10-08 MED ORDER — SULFAMETHOXAZOLE-TRIMETHOPRIM 800-160 MG PO TABS: 1.0000 | ORAL_TABLET | Freq: Two times a day (BID) | ORAL | 0 refills | Status: DC

## 2022-10-08 NOTE — Progress Notes (Signed)
Assessment: 1. BPH with obstruction/lower urinary tract symptoms   2. Urinary frequency   3. History of UTI   4. Abnormal urine findings     Plan: Urine culture sent today. Begin Bactrim DS BID x 7 days.  Rx sent. Continue silodosin 8 mg daily  Return to office in 2 weeks for repeat U/A to confirm treatment of UTI May need further evaluation with cystoscopy   Chief Complaint:  Chief Complaint  Patient presents with   Benign Prostatic Hypertrophy    History of Present Illness:  Dustin Foster is a 88 y.o. male who is seen for further evaluation of BPH with lower urinary tract symptoms.  At his initial visit in 10/23, he reported symptoms of frequency, urgency and nocturia 4-10 times.  He also reported occasional urge incontinence.  He was voiding with a weak stream and was not sure if he emptied his bladder completely.  He was having dysuria which resolved following antibiotic therapy.  He had been on tamsulosin 0.8 mg daily.  No gross hematuria or flank pain. Urinalysis from 08/04/2022 showed >30 WBCs, 0-2 RBCs, many bacteria.  No urine culture performed.  He was treated with Augmentin x7 days. PVR = 98 mL 10/23 He was changed to silodosin 8 mg daily in 10/23. At his follow-up in October 2023, he had not seen a significant change in his symptoms with the silodosin.  He continued with frequency, urgency, and nocturia.  He was having some dysuria as well.  No gross hematuria or flank pain. Urine culture grew 50-100 K Klebsiella.  He was treated with Bactrim DS. He was continued on Rapaflo and given a trial of Myrbetriq 25 mg.  He returns today for follow-up.  He continues on Rapaflo.  He did not see any change in his symptoms with the Myrbetriq.  He did notice some improvement while on the Bactrim.  He currently continues with symptoms of frequency, urgency, intermittent stream, hesitancy, decreased stream, and sensation of incomplete emptying.  He is also having nocturia x 5.  He  does report some dysuria.  No gross hematuria or flank pain.  No fevers or chills. IPSS = 30 today  Portions of the above documentation were copied from a prior visit for review purposes only.   Past Medical History:  Past Medical History:  Diagnosis Date   Diabetes ([place])    Gout    Hyperlipidemia    Hypertension    Osteoarthritis     Past Surgical History:  Past Surgical History:  Procedure Laterality Date   BACK SURGERY     MELANOMA EXCISION      Allergies:  No Known Allergies  Family History:  No family history on file.  Social History:  Social History   Tobacco Use   Smoking status: Former    Types: Cigarettes    Quit date: 1990    Years since quitting: 33.9   Smokeless tobacco: Never    ROS: Constitutional:  Negative for fever, chills, weight loss CV: Negative for chest pain, previous MI, hypertension Respiratory:  Negative for shortness of breath, wheezing, sleep apnea, frequent cough GI:  Negative for nausea, vomiting, bloody stool, GERD  Physical exam: BP (!) 140/73   Pulse 67   Ht '5\' 11"'$  (1.803 m)   Wt 254 lb (115.2 kg)   BMI 35.43 kg/m  GENERAL APPEARANCE:  Well appearing, well developed, well nourished, NAD HEENT:  Atraumatic, normocephalic, oropharynx clear NECK:  Supple without lymphadenopathy or thyromegaly ABDOMEN:  Soft, non-tender, no masses EXTREMITIES:  Moves all extremities well, without clubbing, cyanosis, or edema NEUROLOGIC:  Alert and oriented x 3, normal gait, CN II-XII grossly intact MENTAL STATUS:  appropriate BACK:  Non-tender to palpation, No CVAT SKIN:  Warm, dry, and intact   Results: Results for orders placed or performed in visit on 10/08/22 (from the past 24 hour(s))  POCT urinalysis dipstick     Status: Abnormal   Collection Time: 10/08/22 10:16 AM  Result Value Ref Range   Color, UA     Clarity, UA     Glucose, UA Negative Negative   Bilirubin, UA negative    Ketones, UA trace    Spec Grav, UA 1.020 1.010 -  1.025   Blood, UA Trace-intact    pH, UA 5.5 5.0 - 8.0   Protein, UA Positive (A) Negative   Urobilinogen, UA 0.2 0.2 or 1.0 E.U./dL   Nitrite, UA Positive    Leukocytes, UA Small (1+) (A) Negative   Appearance     Odor     PVR = 85 ml

## 2022-10-08 NOTE — Progress Notes (Signed)
post void residual =52m

## 2022-10-11 LAB — URINE CULTURE

## 2022-10-13 ENCOUNTER — Telehealth: Payer: Self-pay

## 2022-10-13 MED ORDER — SULFAMETHOXAZOLE-TRIMETHOPRIM 800-160 MG PO TABS: 1.0000 | ORAL_TABLET | Freq: Two times a day (BID) | ORAL | 0 refills | Status: AC

## 2022-10-13 NOTE — Addendum Note (Signed)
Addended by: Primus Bravo on: 10/13/2022 12:10 PM   Modules accepted: Orders

## 2022-10-13 NOTE — Telephone Encounter (Signed)
-----   Message from Primus Bravo, MD sent at 10/13/2022 12:10 PM EST ----- Please notify patient to complete Bactrim DS for UTI.   I sent in another 3 days of antibiotics for a total of 10 days of treatment.

## 2022-10-13 NOTE — Telephone Encounter (Signed)
Patient is aware of MD response to urine culture.

## 2022-10-20 NOTE — Progress Notes (Signed)
History of Present Illness:   Assessment: 1. BPH with obstruction/lower urinary tract symptoms   2. Urinary frequency   3. History of UTI   4. Abnormal urine findings       Plan: Urine culture sent today. Begin Bactrim DS BID x 7 days.  Rx sent. Continue silodosin 8 mg daily  Return to office in 2 weeks for repeat U/A to confirm treatment of UTI May need further evaluation with cystoscopy    Chief Complaint:     Chief Complaint  Patient presents with   Benign Prostatic Hypertrophy      History of Present Illness:   Dustin Foster is a 88 y.o. male who is seen for further evaluation of BPH with lower urinary tract symptoms.  At his initial visit in 10/23, he reported symptoms of frequency, urgency and nocturia 4-10 times.  He also reported occasional urge incontinence.  He was voiding with a weak stream and was not sure if he emptied his bladder completely.  He was having dysuria which resolved following antibiotic therapy.  He had been on tamsulosin 0.8 mg daily.  No gross hematuria or flank pain. Urinalysis from 08/04/2022 showed >30 WBCs, 0-2 RBCs, many bacteria.  No urine culture performed.  He was treated with Augmentin x7 days. PVR = 98 mL 10/23 He was changed to silodosin 8 mg daily in 10/23. At his follow-up in October 2023, he had not seen a significant change in his symptoms with the silodosin.  He continued with frequency, urgency, and nocturia.  He was having some dysuria as well.  No gross hematuria or flank pain. Urine culture grew 50-100 K Klebsiella.  He was treated with Bactrim DS. He was continued on Rapaflo and given a trial of Myrbetriq 25 mg.   He returns today for follow-up.  He continues on Rapaflo.  He did not see any change in his symptoms with the Myrbetriq.  He did notice some improvement while on the Bactrim.  He currently continues with symptoms of frequency, urgency, intermittent stream, hesitancy, decreased stream, and sensation of incomplete emptying.   He is also having nocturia x 5.  He does report some dysuria.  No gross hematuria or flank pain.  No fevers or chills. IPSS = 30 today  Past Medical History:  Diagnosis Date   Diabetes ([place])    Gout    Hyperlipidemia    Hypertension    Osteoarthritis     Past Surgical History:  Procedure Laterality Date   BACK SURGERY     MELANOMA EXCISION      Home Medications:  Allergies as of 10/21/2022   No Known Allergies      Medication List        Accurate as of October 20, 2022  9:11 AM. If you have any questions, ask your nurse or doctor.          allopurinol 300 MG tablet Commonly known as: ZYLOPRIM   amLODipine 2.5 MG tablet Commonly known as: NORVASC   Aspirin 81 81 MG chewable tablet Generic drug: aspirin Chew 81 mg by mouth daily.   mirabegron ER 25 MG Tb24 tablet Commonly known as: MYRBETRIQ Take 1 tablet (25 mg total) by mouth daily.   silodosin 8 MG Caps capsule Commonly known as: RAPAFLO Take 1 capsule (8 mg total) by mouth daily with breakfast.        Allergies: No Known Allergies  No family history on file.  Social History:  reports that he quit smoking  about 33 years ago. His smoking use included cigarettes. He has never used smokeless tobacco. No history on file for alcohol use and drug use.  ROS: A complete review of systems was performed.  All systems are negative except for pertinent findings as noted.  Physical Exam:  Vital signs in last 24 hours: There were no vitals taken for this visit. Constitutional:  Alert and oriented, No acute distress Cardiovascular: Regular rate  Respiratory: Normal respiratory effort GI: Abdomen is soft, nontender, nondistended, no abdominal masses. No CVAT.  Genitourinary: Normal male phallus, testes are descended bilaterally and non-tender and without masses, scrotum is normal in appearance without lesions or masses, perineum is normal on inspection. Lymphatic: No lymphadenopathy Neurologic: Grossly  intact, no focal deficits Psychiatric: Normal mood and affect  I have reviewed prior pt notes  I have reviewed notes from referring/previous physicians  I have reviewed urinalysis results  I have independently reviewed prior imaging  I have reviewed prior PSA results  I have reviewed prior urine culture   Impression/Assessment:  ***  Plan:  ***

## 2022-10-21 ENCOUNTER — Encounter: Payer: Self-pay | Admitting: Urology

## 2022-10-21 ENCOUNTER — Ambulatory Visit: Payer: Medicare HMO | Admitting: Urology

## 2022-10-21 VITALS — BP 122/67 | HR 76 | Ht 71.0 in | Wt 254.0 lb

## 2022-10-21 DIAGNOSIS — N138 Other obstructive and reflux uropathy: Secondary | ICD-10-CM | POA: Diagnosis not present

## 2022-10-21 DIAGNOSIS — N401 Enlarged prostate with lower urinary tract symptoms: Secondary | ICD-10-CM | POA: Diagnosis not present

## 2022-10-21 DIAGNOSIS — R350 Frequency of micturition: Secondary | ICD-10-CM | POA: Diagnosis not present

## 2022-10-21 DIAGNOSIS — Z87440 Personal history of urinary (tract) infections: Secondary | ICD-10-CM | POA: Diagnosis not present

## 2022-10-21 LAB — URINALYSIS, ROUTINE W REFLEX MICROSCOPIC
Bilirubin, UA: NEGATIVE
Glucose, UA: NEGATIVE
Ketones, UA: NEGATIVE
Leukocytes,UA: NEGATIVE
Nitrite, UA: NEGATIVE
Protein,UA: NEGATIVE
Specific Gravity, UA: 1.02 (ref 1.005–1.030)
Urobilinogen, Ur: 0.2 mg/dL (ref 0.2–1.0)
pH, UA: 5 (ref 5.0–7.5)

## 2022-10-21 LAB — BLADDER SCAN AMB NON-IMAGING: Scan Result: 19

## 2022-10-21 LAB — MICROSCOPIC EXAMINATION
Bacteria, UA: NONE SEEN
RBC, Urine: NONE SEEN /hpf (ref 0–2)

## 2022-10-21 NOTE — Progress Notes (Signed)
post void residual =70m

## 2022-10-28 DIAGNOSIS — M79676 Pain in unspecified toe(s): Secondary | ICD-10-CM | POA: Diagnosis not present

## 2022-10-28 DIAGNOSIS — B351 Tinea unguium: Secondary | ICD-10-CM | POA: Diagnosis not present

## 2022-11-11 ENCOUNTER — Telehealth: Payer: Self-pay

## 2022-11-11 NOTE — Telephone Encounter (Signed)
Patient wanting a refill on antibiotics due to continued burning, frequent urination.   Please advise.  Call back:  [phone] Jerilynn Mages)   Thanks, Helene Kelp

## 2022-11-12 ENCOUNTER — Ambulatory Visit (INDEPENDENT_AMBULATORY_CARE_PROVIDER_SITE_OTHER): Payer: Medicare HMO | Admitting: Urology

## 2022-11-12 ENCOUNTER — Telehealth: Payer: Self-pay

## 2022-11-12 DIAGNOSIS — Z87440 Personal history of urinary (tract) infections: Secondary | ICD-10-CM

## 2022-11-12 LAB — URINALYSIS, ROUTINE W REFLEX MICROSCOPIC
Bilirubin, UA: NEGATIVE
Glucose, UA: NEGATIVE
Ketones, UA: NEGATIVE
Nitrite, UA: POSITIVE — AB
Specific Gravity, UA: 1.02 (ref 1.005–1.030)
Urobilinogen, Ur: 0.2 mg/dL (ref 0.2–1.0)
pH, UA: 6.5 (ref 5.0–7.5)

## 2022-11-12 LAB — MICROSCOPIC EXAMINATION: WBC, UA: >30 /hpf — AB (ref 0–5)

## 2022-11-12 NOTE — Telephone Encounter (Signed)
Patient calling in today wanting a refill antibx due to continued burning and frequent urination. Made patient aware to come into the office today for a UC/UA. Patient voiced understanding

## 2022-11-16 LAB — URINE CULTURE

## 2022-11-17 NOTE — Progress Notes (Addendum)
UA/UC drop off. Patient UA/UC will be assessed.

## 2022-11-18 ENCOUNTER — Telehealth: Payer: Self-pay

## 2022-11-18 MED ORDER — SULFAMETHOXAZOLE-TRIMETHOPRIM 800-160 MG PO TABS: 1.0000 | ORAL_TABLET | Freq: Two times a day (BID) | ORAL | 0 refills | Status: DC

## 2022-11-18 NOTE — Telephone Encounter (Signed)
Made patient aware that his urine culture was positive for infection and antibx was sent to his pharmacy to cover the infection. Patient voiced understanding

## 2022-11-18 NOTE — Telephone Encounter (Signed)
-----   Message from Cleon Gustin, MD sent at 11/18/2022 10:42 AM EST ----- Bactrim DS BID for 7 days ----- Message ----- From: Sherrilyn Rist, CMA Sent: 11/17/2022   8:45 AM EST To: Cleon Gustin, MD  Please review

## 2022-12-08 NOTE — Progress Notes (Signed)
History of Present Illness: Here for follow up of OAB sx's as well as UTI.    Past Medical History:  Diagnosis Date   Diabetes ([place])    Gout    Hyperlipidemia    Hypertension    Osteoarthritis     Past Surgical History:  Procedure Laterality Date   BACK SURGERY     MELANOMA EXCISION      Home Medications:  Allergies as of 12/09/2022   No Known Allergies      Medication List        Accurate as of December 08, 2022  5:46 PM. If you have any questions, ask your nurse or doctor.          allopurinol 300 MG tablet Commonly known as: ZYLOPRIM   amLODipine 2.5 MG tablet Commonly known as: NORVASC   Aspirin 81 81 MG chewable tablet Generic drug: aspirin Chew 81 mg by mouth daily.   mirabegron ER 25 MG Tb24 tablet Commonly known as: MYRBETRIQ Take 1 tablet (25 mg total) by mouth daily.   silodosin 8 MG Caps capsule Commonly known as: RAPAFLO Take 1 capsule (8 mg total) by mouth daily with breakfast.   sulfamethoxazole-trimethoprim 800-160 MG tablet Commonly known as: BACTRIM DS Take 1 tablet by mouth 2 (two) times daily.        Allergies: No Known Allergies  No family history on file.  Social History:  reports that he quit smoking about 34 years ago. His smoking use included cigarettes. He has never used smokeless tobacco. No history on file for alcohol use and drug use.  ROS: A complete review of systems was performed.  All systems are negative except for pertinent findings as noted.  Physical Exam:  Vital signs in last 24 hours: There were no vitals taken for this visit. Constitutional:  Alert and oriented, No acute distress Cardiovascular: Regular rate  Respiratory: Normal respiratory effort GI: Abdomen is soft, nontender, nondistended, no abdominal masses. No CVAT.  Genitourinary: Normal male phallus, testes are descended bilaterally and non-tender and without masses, scrotum is normal in appearance without lesions or masses, perineum is normal on  inspection. Lymphatic: No lymphadenopathy Neurologic: Grossly intact, no focal deficits Psychiatric: Normal mood and affect  I have reviewed prior pt notes  I have reviewed notes from referring/previous physicians  I have reviewed urinalysis results  I have independently reviewed prior imaging  I have reviewed prior PSA results  I have reviewed prior urine culture   Impression/Assessment:  ***  Plan:  ***

## 2022-12-09 ENCOUNTER — Ambulatory Visit: Payer: Medicare HMO | Admitting: Urology

## 2022-12-09 ENCOUNTER — Encounter: Payer: Self-pay | Admitting: Urology

## 2022-12-09 VITALS — BP 166/77 | HR 65

## 2022-12-09 DIAGNOSIS — R350 Frequency of micturition: Secondary | ICD-10-CM | POA: Diagnosis not present

## 2022-12-09 DIAGNOSIS — Z87440 Personal history of urinary (tract) infections: Secondary | ICD-10-CM | POA: Diagnosis not present

## 2022-12-09 DIAGNOSIS — N138 Other obstructive and reflux uropathy: Secondary | ICD-10-CM

## 2022-12-09 DIAGNOSIS — I10 Essential (primary) hypertension: Secondary | ICD-10-CM | POA: Diagnosis not present

## 2022-12-09 DIAGNOSIS — N401 Enlarged prostate with lower urinary tract symptoms: Secondary | ICD-10-CM | POA: Diagnosis not present

## 2022-12-09 DIAGNOSIS — E785 Hyperlipidemia, unspecified: Secondary | ICD-10-CM | POA: Diagnosis not present

## 2022-12-09 LAB — URINALYSIS, ROUTINE W REFLEX MICROSCOPIC
Bilirubin, UA: NEGATIVE
Glucose, UA: NEGATIVE
Ketones, UA: NEGATIVE
Nitrite, UA: POSITIVE — AB
Specific Gravity, UA: 1.02 (ref 1.005–1.030)
Urobilinogen, Ur: 0.2 mg/dL (ref 0.2–1.0)
pH, UA: 5.5 (ref 5.0–7.5)

## 2022-12-09 LAB — MICROSCOPIC EXAMINATION: WBC, UA: >30 /hpf — AB (ref 0–5)

## 2022-12-09 MED ORDER — METHENAMINE HIPPURATE 1 G PO TABS: 1.0000 g | ORAL_TABLET | Freq: Two times a day (BID) | ORAL | 11 refills | Status: DC

## 2022-12-09 MED ORDER — SULFAMETHOXAZOLE-TRIMETHOPRIM 800-160 MG PO TABS: 1.0000 | ORAL_TABLET | Freq: Two times a day (BID) | ORAL | 0 refills | Status: DC

## 2022-12-13 LAB — URINE CULTURE

## 2022-12-16 ENCOUNTER — Telehealth: Payer: Self-pay

## 2022-12-16 NOTE — Telephone Encounter (Signed)
Patient is aware to continued taking the antibiotic as prescribed. Patient voiced understanding

## 2022-12-16 NOTE — Telephone Encounter (Signed)
-----   Message from Franchot Gallo, MD sent at 12/16/2022 12:33 PM EST ----- Let patient know that the same bacteria grew out, continue the antibiotic as prescribed. ----- Message ----- From: Sherrilyn Rist, CMA Sent: 12/15/2022  11:47 AM EST To: Franchot Gallo, MD  Please review

## 2023-01-06 DIAGNOSIS — M79676 Pain in unspecified toe(s): Secondary | ICD-10-CM | POA: Diagnosis not present

## 2023-01-06 DIAGNOSIS — B351 Tinea unguium: Secondary | ICD-10-CM | POA: Diagnosis not present

## 2023-01-26 DIAGNOSIS — I10 Essential (primary) hypertension: Secondary | ICD-10-CM | POA: Diagnosis not present

## 2023-01-26 DIAGNOSIS — E1129 Type 2 diabetes mellitus with other diabetic kidney complication: Secondary | ICD-10-CM | POA: Diagnosis not present

## 2023-01-26 DIAGNOSIS — M1000 Idiopathic gout, unspecified site: Secondary | ICD-10-CM | POA: Diagnosis not present

## 2023-01-26 DIAGNOSIS — N400 Enlarged prostate without lower urinary tract symptoms: Secondary | ICD-10-CM | POA: Diagnosis not present

## 2023-01-26 DIAGNOSIS — N1831 Chronic kidney disease, stage 3a: Secondary | ICD-10-CM | POA: Diagnosis not present

## 2023-01-26 NOTE — Progress Notes (Signed)
History of Present Illness: 89 yo man returns for further mgmt of recurrent UTIs w/ same Klebsiella aerogenes organism. At last visit he was put on 3 mos of suppressive Rx w/ methenamine.  3.19.2024: He is still on methenamine hippurate twice a day as well as Myrbetriq and silodosin.  Voiding adequately.  No recent urinary tract infections.  Past Medical History:  Diagnosis Date   Diabetes ([place])    Gout    Hyperlipidemia    Hypertension    Osteoarthritis     Past Surgical History:  Procedure Laterality Date   BACK SURGERY     MELANOMA EXCISION      Home Medications:  Allergies as of 01/27/2023   No Known Allergies      Medication List        Accurate as of January 26, 2023  8:17 PM. If you have any questions, ask your nurse or doctor.          allopurinol 300 MG tablet Commonly known as: ZYLOPRIM   amLODipine 2.5 MG tablet Commonly known as: NORVASC   Aspirin 81 81 MG chewable tablet Generic drug: aspirin Chew 81 mg by mouth daily.   methenamine 1 g tablet Commonly known as: HIPREX Take 1 tablet (1 g total) by mouth 2 (two) times daily with a meal.   mirabegron ER 25 MG Tb24 tablet Commonly known as: MYRBETRIQ Take 1 tablet (25 mg total) by mouth daily.   silodosin 8 MG Caps capsule Commonly known as: RAPAFLO Take 1 capsule (8 mg total) by mouth daily with breakfast.   sulfamethoxazole-trimethoprim 800-160 MG tablet Commonly known as: BACTRIM DS Take 1 tablet by mouth 2 (two) times daily.   sulfamethoxazole-trimethoprim 800-160 MG tablet Commonly known as: BACTRIM DS Take 1 tablet by mouth 2 (two) times daily.        Allergies: No Known Allergies  No family history on file.  Social History:  reports that he quit smoking about 34 years ago. His smoking use included cigarettes. He has never used smokeless tobacco. No history on file for alcohol use and drug use.  ROS: A complete review of systems was performed.  All systems are negative except  for pertinent findings as noted.  Physical Exam:  Vital signs in last 24 hours: There were no vitals taken for this visit. Constitutional:  Alert and oriented, No acute distress Cardiovascular: Regular rate  Respiratory: Normal respiratory effort Neurologic: Grossly intact, no focal deficits Psychiatric: Normal mood and affect  I have reviewed prior pt notes  I have reviewed urinalysis results  I have independently reviewed prior imaging  I have reviewed prior urine culture   Impression/Assessment:  History of recurrent urinary tract infections, doing well on his current suppression  BPH with symptoms, stable on alfuzosin and Myrbetriq  Plan:  I will have him wean off the methenamine when his current prescription runs out  I will see him back in about 4 months for recheck

## 2023-01-27 ENCOUNTER — Encounter: Payer: Self-pay | Admitting: Urology

## 2023-01-27 ENCOUNTER — Ambulatory Visit: Payer: Medicare HMO | Admitting: Urology

## 2023-01-27 VITALS — BP 155/72 | HR 55 | Ht 71.0 in | Wt 254.0 lb

## 2023-01-27 DIAGNOSIS — R350 Frequency of micturition: Secondary | ICD-10-CM | POA: Diagnosis not present

## 2023-01-27 DIAGNOSIS — N401 Enlarged prostate with lower urinary tract symptoms: Secondary | ICD-10-CM

## 2023-01-27 DIAGNOSIS — Z87440 Personal history of urinary (tract) infections: Secondary | ICD-10-CM

## 2023-01-27 DIAGNOSIS — N138 Other obstructive and reflux uropathy: Secondary | ICD-10-CM

## 2023-01-27 LAB — URINALYSIS, ROUTINE W REFLEX MICROSCOPIC
Bilirubin, UA: NEGATIVE
Glucose, UA: NEGATIVE
Ketones, UA: NEGATIVE
Leukocytes,UA: NEGATIVE
Nitrite, UA: NEGATIVE
Protein,UA: NEGATIVE
Specific Gravity, UA: 1.02 (ref 1.005–1.030)
Urobilinogen, Ur: 0.2 mg/dL (ref 0.2–1.0)
pH, UA: 6 (ref 5.0–7.5)

## 2023-01-27 LAB — MICROSCOPIC EXAMINATION: Bacteria, UA: NONE SEEN

## 2023-02-02 DIAGNOSIS — E1122 Type 2 diabetes mellitus with diabetic chronic kidney disease: Secondary | ICD-10-CM | POA: Diagnosis not present

## 2023-02-02 DIAGNOSIS — I10 Essential (primary) hypertension: Secondary | ICD-10-CM | POA: Diagnosis not present

## 2023-02-02 DIAGNOSIS — R7309 Other abnormal glucose: Secondary | ICD-10-CM | POA: Diagnosis not present

## 2023-02-02 DIAGNOSIS — M109 Gout, unspecified: Secondary | ICD-10-CM | POA: Diagnosis not present

## 2023-02-02 DIAGNOSIS — N1831 Chronic kidney disease, stage 3a: Secondary | ICD-10-CM | POA: Diagnosis not present

## 2023-03-17 DIAGNOSIS — B351 Tinea unguium: Secondary | ICD-10-CM | POA: Diagnosis not present

## 2023-03-17 DIAGNOSIS — M79676 Pain in unspecified toe(s): Secondary | ICD-10-CM | POA: Diagnosis not present

## 2023-05-04 NOTE — Progress Notes (Signed)
History of Present Illness: 89 yo man returns for further mgmt of recurrent UTIs w/ same Klebsiella aerogenes organism. At last visit he was put on 3 mos of suppressive Rx w/ methenamine.  3.19.2024: He is still on methenamine hippurate twice a day as well as Myrbetriq and silodosin.  Voiding adequately.  No recent urinary tract infections.  6.25.2024: Here for routine check.  He has been off methenamine has not been on antibiotics recently.  He is happy with his voiding.  He has had no gross hematuria or dysuria.  He is also off of Myrbetriq.  Past Medical History:  Diagnosis Date   Diabetes (HCC)    Gout    Hyperlipidemia    Hypertension    Osteoarthritis     Past Surgical History:  Procedure Laterality Date   BACK SURGERY     MELANOMA EXCISION      Home Medications:  Allergies as of 05/05/2023   No Known Allergies      Medication List        Accurate as of May 05, 2023 11:03 AM. If you have any questions, ask your nurse or doctor.          allopurinol 300 MG tablet Commonly known as: ZYLOPRIM   amLODipine 2.5 MG tablet Commonly known as: NORVASC   Aspirin 81 81 MG chewable tablet Generic drug: aspirin Chew 81 mg by mouth daily.   methenamine 1 g tablet Commonly known as: HIPREX Take 1 tablet (1 g total) by mouth 2 (two) times daily with a meal.   mirabegron ER 25 MG Tb24 tablet Commonly known as: MYRBETRIQ Take 1 tablet (25 mg total) by mouth daily.   silodosin 8 MG Caps capsule Commonly known as: RAPAFLO Take 1 capsule (8 mg total) by mouth daily with breakfast.   sulfamethoxazole-trimethoprim 800-160 MG tablet Commonly known as: BACTRIM DS Take 1 tablet by mouth 2 (two) times daily.   sulfamethoxazole-trimethoprim 800-160 MG tablet Commonly known as: BACTRIM DS Take 1 tablet by mouth 2 (two) times daily.        Allergies: No Known Allergies  History reviewed. No pertinent family history.  Social History:  reports that he quit smoking  about 34 years ago. His smoking use included cigarettes. He has never used smokeless tobacco. No history on file for alcohol use and drug use.  ROS: A complete review of systems was performed.  All systems are negative except for pertinent findings as noted.  Physical Exam:  Vital signs in last 24 hours: BP (!) 154/70   Pulse 79  Constitutional:  Alert and oriented, No acute distress Cardiovascular: Regular rate  Respiratory: Normal respiratory effort Neurologic: Grossly intact, no focal deficits Psychiatric: Normal mood and affect  I have reviewed prior pt notes  I have reviewed urinalysis results  I have independently reviewed prior imaging  I have reviewed prior urine culture   Impression/Assessment:  History of recurrent urinary tract infections, now sent in free after being on suppression for a few months  BPH with symptoms, off of Myrbetriq.  On silodosin.  Plan:  Continue on silodosin.  Stop all other GU medications  I will see him back in a year for recheck

## 2023-05-05 ENCOUNTER — Ambulatory Visit: Payer: Medicare HMO | Admitting: Urology

## 2023-05-05 ENCOUNTER — Encounter: Payer: Self-pay | Admitting: Urology

## 2023-05-05 VITALS — BP 154/70 | HR 79

## 2023-05-05 DIAGNOSIS — R350 Frequency of micturition: Secondary | ICD-10-CM | POA: Diagnosis not present

## 2023-05-05 DIAGNOSIS — N401 Enlarged prostate with lower urinary tract symptoms: Secondary | ICD-10-CM | POA: Diagnosis not present

## 2023-05-05 DIAGNOSIS — Z87440 Personal history of urinary (tract) infections: Secondary | ICD-10-CM

## 2023-05-05 DIAGNOSIS — N138 Other obstructive and reflux uropathy: Secondary | ICD-10-CM

## 2023-05-05 LAB — URINALYSIS, ROUTINE W REFLEX MICROSCOPIC
Bilirubin, UA: NEGATIVE
Glucose, UA: NEGATIVE
Ketones, UA: NEGATIVE
Leukocytes,UA: NEGATIVE
Nitrite, UA: NEGATIVE
Protein,UA: NEGATIVE
Specific Gravity, UA: 1.02 (ref 1.005–1.030)
Urobilinogen, Ur: 0.2 mg/dL (ref 0.2–1.0)
pH, UA: 6 (ref 5.0–7.5)

## 2023-05-05 LAB — MICROSCOPIC EXAMINATION: Bacteria, UA: NONE SEEN

## 2023-05-26 ENCOUNTER — Other Ambulatory Visit: Payer: Self-pay

## 2023-05-26 ENCOUNTER — Encounter (HOSPITAL_COMMUNITY): Payer: Self-pay

## 2023-05-26 ENCOUNTER — Emergency Department (HOSPITAL_COMMUNITY): Payer: Medicare HMO

## 2023-05-26 ENCOUNTER — Inpatient Hospital Stay: Admit: 2023-05-26 | Payer: Medicare HMO | Admitting: Internal Medicine

## 2023-05-26 ENCOUNTER — Inpatient Hospital Stay (HOSPITAL_COMMUNITY)
Admission: EM | Admit: 2023-05-26 | Discharge: 2023-05-28 | DRG: 244 | Disposition: A | Discharge status: Home / Self Care | Payer: Medicare HMO | Source: Ambulatory Visit | Attending: Internal Medicine | Admitting: Internal Medicine

## 2023-05-26 DIAGNOSIS — Z85820 Personal history of malignant melanoma of skin: Secondary | ICD-10-CM

## 2023-05-26 DIAGNOSIS — R9431 Abnormal electrocardiogram [ECG] [EKG]: Secondary | ICD-10-CM | POA: Diagnosis not present

## 2023-05-26 DIAGNOSIS — Z87891 Personal history of nicotine dependence: Secondary | ICD-10-CM | POA: Diagnosis not present

## 2023-05-26 DIAGNOSIS — I442 Atrioventricular block, complete: Principal | ICD-10-CM | POA: Diagnosis present

## 2023-05-26 DIAGNOSIS — I10 Essential (primary) hypertension: Secondary | ICD-10-CM

## 2023-05-26 DIAGNOSIS — E119 Type 2 diabetes mellitus without complications: Secondary | ICD-10-CM | POA: Diagnosis not present

## 2023-05-26 DIAGNOSIS — Z7982 Long term (current) use of aspirin: Secondary | ICD-10-CM

## 2023-05-26 DIAGNOSIS — M79676 Pain in unspecified toe(s): Secondary | ICD-10-CM | POA: Diagnosis not present

## 2023-05-26 DIAGNOSIS — J939 Pneumothorax, unspecified: Secondary | ICD-10-CM | POA: Diagnosis not present

## 2023-05-26 DIAGNOSIS — R0609 Other forms of dyspnea: Secondary | ICD-10-CM | POA: Diagnosis not present

## 2023-05-26 DIAGNOSIS — E782 Mixed hyperlipidemia: Secondary | ICD-10-CM | POA: Diagnosis not present

## 2023-05-26 DIAGNOSIS — Z79899 Other long term (current) drug therapy: Secondary | ICD-10-CM

## 2023-05-26 DIAGNOSIS — B351 Tinea unguium: Secondary | ICD-10-CM | POA: Diagnosis not present

## 2023-05-26 DIAGNOSIS — R918 Other nonspecific abnormal finding of lung field: Secondary | ICD-10-CM | POA: Diagnosis not present

## 2023-05-26 DIAGNOSIS — I517 Cardiomegaly: Secondary | ICD-10-CM | POA: Diagnosis not present

## 2023-05-26 DIAGNOSIS — E669 Obesity, unspecified: Secondary | ICD-10-CM | POA: Diagnosis not present

## 2023-05-26 DIAGNOSIS — I119 Hypertensive heart disease without heart failure: Secondary | ICD-10-CM | POA: Diagnosis present

## 2023-05-26 DIAGNOSIS — I4510 Unspecified right bundle-branch block: Secondary | ICD-10-CM | POA: Diagnosis not present

## 2023-05-26 DIAGNOSIS — Z6835 Body mass index (BMI) 35.0-35.9, adult: Secondary | ICD-10-CM

## 2023-05-26 DIAGNOSIS — M1990 Unspecified osteoarthritis, unspecified site: Secondary | ICD-10-CM | POA: Diagnosis not present

## 2023-05-26 HISTORY — DX: Type 2 diabetes mellitus without complications: E11.9

## 2023-05-26 HISTORY — DX: Essential (primary) hypertension: I10

## 2023-05-26 LAB — CBC WITH DIFFERENTIAL/PLATELET
Abs Immature Granulocytes: 0.04 10*3/uL (ref 0.00–0.07)
Basophils Absolute: 0.1 10*3/uL (ref 0.0–0.1)
Basophils Relative: 1 %
Eosinophils Absolute: 0.3 10*3/uL (ref 0.0–0.5)
Eosinophils Relative: 4 %
HCT: 41.8 % (ref 39.0–52.0)
Hemoglobin: 13.9 g/dL (ref 13.0–17.0)
Immature Granulocytes: 1 %
Lymphocytes Relative: 34 %
Lymphs Abs: 2.6 10*3/uL (ref 0.7–4.0)
MCH: 30.6 pg (ref 26.0–34.0)
MCHC: 33.3 g/dL (ref 30.0–36.0)
MCV: 92.1 fL (ref 80.0–100.0)
Monocytes Absolute: 0.8 10*3/uL (ref 0.1–1.0)
Monocytes Relative: 10 %
Neutro Abs: 3.9 10*3/uL (ref 1.7–7.7)
Neutrophils Relative %: 50 %
Platelets: 205 10*3/uL (ref 150–400)
RBC: 4.54 MIL/uL (ref 4.22–5.81)
RDW: 15.3 % (ref 11.5–15.5)
WBC: 7.7 10*3/uL (ref 4.0–10.5)
nRBC: 0 % (ref 0.0–0.2)

## 2023-05-26 LAB — TSH: TSH: 2.702 u[IU]/mL (ref 0.350–4.500)

## 2023-05-26 LAB — TROPONIN I (HIGH SENSITIVITY): Troponin I (High Sensitivity): 16 ng/L (ref <18)

## 2023-05-26 LAB — BASIC METABOLIC PANEL
Anion gap: 6 (ref 5–15)
BUN: 30 mg/dL — ABNORMAL HIGH (ref 8–23)
CO2: 22 mmol/L (ref 22–32)
Calcium: 8.9 mg/dL (ref 8.9–10.3)
Chloride: 112 mmol/L — ABNORMAL HIGH (ref 98–111)
Creatinine, Ser: 1.35 mg/dL — ABNORMAL HIGH (ref 0.61–1.24)
GFR, Estimated: 51 mL/min — ABNORMAL LOW (ref >60)
Glucose, Bld: 98 mg/dL (ref 70–99)
Potassium: 4.2 mmol/L (ref 3.5–5.1)
Sodium: 140 mmol/L (ref 135–145)

## 2023-05-26 LAB — MAGNESIUM: Magnesium: 2.2 mg/dL (ref 1.7–2.4)

## 2023-05-26 LAB — BRAIN NATRIURETIC PEPTIDE: B Natriuretic Peptide: 297 pg/mL — ABNORMAL HIGH (ref 0.0–100.0)

## 2023-05-26 LAB — T4, FREE: Free T4: 0.77 ng/dL (ref 0.61–1.12)

## 2023-05-26 MED ORDER — AMLODIPINE BESYLATE 2.5 MG PO TABS
2.5000 mg | ORAL_TABLET | Freq: Every day | ORAL | Status: DC
  Administered 2023-05-26: 2.5 mg via ORAL
  Filled 2023-05-26 (×2): qty 1

## 2023-05-26 MED ORDER — ASPIRIN 81 MG PO TBEC
81.0000 mg | DELAYED_RELEASE_TABLET | Freq: Every day | ORAL | Status: DC
  Filled 2023-05-26: qty 1

## 2023-05-26 MED ORDER — ACETAMINOPHEN 325 MG PO TABS: 650.0000 mg | ORAL_TABLET | ORAL | Status: DC | PRN

## 2023-05-26 MED ORDER — TAMSULOSIN HCL 0.4 MG PO CAPS
0.4000 mg | ORAL_CAPSULE | Freq: Every day | ORAL | Status: DC
  Administered 2023-05-26: 0.4 mg via ORAL
  Filled 2023-05-26 (×2): qty 1

## 2023-05-26 MED ORDER — HEPARIN SODIUM (PORCINE) 5000 UNIT/ML IJ SOLN
5000.0000 [IU] | Freq: Three times a day (TID) | INTRAMUSCULAR | Status: DC
  Administered 2023-05-26 – 2023-05-27 (×2): 5000 [IU] via SUBCUTANEOUS
  Filled 2023-05-26 (×2): qty 1

## 2023-05-26 MED ORDER — ONDANSETRON HCL 4 MG/2ML IJ SOLN: 4.0000 mg | Freq: Four times a day (QID) | INTRAMUSCULAR | Status: DC | PRN

## 2023-05-26 NOTE — H&P (Signed)
Cardiology Admission History and Physical:   Patient ID: GRADEN HOSHINO; [id number]; 1934-07-11   Admission date: 05/26/2023  Primary Care Provider: Carylon Perches, MD Primary Cardiologist: New to [facility]  Chief Complaint: Lightheadedness and fatigue  History of Present Illness:   Mr. Safi is an 89 y.o. male with past medical history outlined below, lives independently in [place], a retired Personnel officer.  He states that on Saturday he all of a sudden felt lightheaded and fatigued with typical ADLs, no chest pain or syncope.  These symptoms continued and he was ultimately encouraged to see his PCP Dr. Ouida Sills today where he was found to be significantly bradycardic in the 30s and referred to the [facility] ER.  He is in complete heart block by ECG.  There are no old tracings for comparison.  He reports no known history of cardiovascular disease, cardiomyopathy, valvular heart disease, thyroid disease, or tickborne illness.  He is not on any AV nodal blockers at baseline.  Initial lab work shows high-sensitivity troponin I level of 16, hemoglobin 13.9, creatinine 1.35 with potassium 4.2.  ROS:  Pertinent review in the history of present illness.  No syncope, no palpitations, no orthopnea or PND.  Past Medical History:  Diagnosis Date   Essential hypertension    Gout    Hyperlipidemia    Osteoarthritis    Type 2 diabetes mellitus (HCC)     Past Surgical History:  Procedure Laterality Date   BACK SURGERY     MELANOMA EXCISION       Social History:   Social History   Tobacco Use   Smoking status: Former    Current packs/day: 0.00    Types: Cigarettes    Quit date: 1990    Years since quitting: 34.5   Smokeless tobacco: Never  Substance Use Topics   Alcohol use: Not Currently    Comment: every couple weeks    Family History: The patient's family history includes Arrhythmia in his father.    Medications Prior to Admission: Prior to Admission medications    Medication Sig Start Date End Date Taking? Authorizing Provider  allopurinol (ZYLOPRIM) 300 MG tablet  06/03/22   [provider]  amLODipine (NORVASC) 2.5 MG tablet  04/18/22   [provider]  aspirin (ASPIRIN 81) 81 MG chewable tablet Chew 81 mg by mouth daily.    [provider]  silodosin (RAPAFLO) 8 MG CAPS capsule Take 1 capsule (8 mg total) by mouth daily with breakfast. 08/12/22   Milderd Meager., MD     Allergies:   No Known Allergies  Physical Exam/Data:   Vitals:   05/26/23 1543 05/26/23 1600 05/26/23 1615 05/26/23 1630  BP: (!) 172/54 (!) 175/56 (!) 160/56 (!) 167/57  Pulse: (!) 40 (!) 39 (!) 38 (!) 39  Resp: 15 13 20 14   Temp: 98.9 F (37.2 C)     TempSrc: Oral     SpO2: 98% 96% 94% 96%  Weight:      Height:       No intake or output data in the 24 hours ending 05/26/23 1651 Filed Weights   05/26/23 1539  Weight: 115.2 kg   Body mass index is 35.43 kg/m.   Gen: Patient appears comfortable at rest. HEENT: Conjunctiva and lids normal. Neck: Supple, no elevated JVP or carotid bruits. Lungs: Clear to auscultation, nonlabored breathing at rest. Cardiac: Regular rate and rhythm, no S3, 2/6 systolic murmur, no pericardial rub. Abdomen: Soft, nontender, bowel sounds present.  Extremities: No pitting edema, distal pulses 2+. Skin: Warm and dry. Musculoskeletal: No kyphosis. Neuropsychiatric: Alert and oriented x3, affect grossly appropriate.   EKG:  An ECG dated 05/26/2023 was personally reviewed today and demonstrated:  Sinus rhythm with complete heart block, right bundle branch block with heart rate 41 bpm.   Relevant CV Studies:  No prior cardiac testing for review today.  Laboratory Data:  Chemistry Recent Labs  Lab 05/26/23 1558  NA 140  K 4.2  CL 112*  CO2 22  GLUCOSE 98  BUN 30*  CREATININE 1.35*  CALCIUM 8.9  GFRNONAA 51*  ANIONGAP 6     Hematology Recent Labs  Lab 05/26/23 1558  WBC 7.7  RBC 4.54  HGB  13.9  HCT 41.8  MCV 92.1  MCH 30.6  MCHC 33.3  RDW 15.3  PLT 205   Cardiac Enzymes Recent Labs  Lab 05/26/23 1558  TROPONINIHS 16    Radiology/Studies:  DG Chest Portable 1 View  Result Date: 05/26/2023 CLINICAL DATA:  Dyspnea on exertion.  Complete heart block. EXAM: PORTABLE CHEST 1 VIEW COMPARISON:  None Available. FINDINGS: Cardiac enlargement. No vascular congestion. Emphysematous changes in the lungs. Linear interstitial changes in the lung bases may represent fibrosis or early edema. No pleural effusions. No pneumothorax. No focal consolidation. Mediastinal contours appear intact. Degenerative changes in the spine and shoulders. IMPRESSION: 1. Cardiac enlargement. 2. Emphysematous changes in the lungs. 3. Mild linear interstitial changes in the lung bases could indicate fibrosis or early edema. Electronically Signed   By: Burman Nieves M.D.   On: 05/26/2023 16:43    Assessment and Plan:   1.  Symptomatic complete heart block in the absence of AV nodal blockers consistent with progressive conduction system disease in an 89 year old male with hypertension, hyperlipidemia, and type 2 diabetes mellitus.  No clear evidence of ACS, high-sensitivity troponin I 16 and no acute ST segment changes.  TSH sent and pending.  He is hemodynamically stable, in fact hypertensive at this time with heart rate in the 30s to 40s at baseline.  Symptom onset was Saturday.  Does have systolic murmur in aortic position and will need an echocardiogram to exclude valvular heart disease.  2.  Essential hypertension, on Norvasc as an outpatient.  3.  Reported history of type 2 diabetes mellitus and mixed hyperlipidemia, managed by diet via PCP at this point.  Reviewed with patient.  We will get him admitted to our cardiology service at [facility], orders placed and [facility] contacted.  Dr. Carolan Clines is the accepting physician.  Add TSH to lab work, continue telemetry, echocardiogram also ordered.  Plan to  keep him n.p.o. after midnight for EP consultation tomorrow regarding pacemaker.  Plan admission to progressive care unit for now in case clinical situation changes.  Signed, Nona Dell, MD  05/26/2023 4:51 PM

## 2023-05-26 NOTE — ED Notes (Signed)
 [facility] at bedside for transport. 

## 2023-05-26 NOTE — ED Notes (Signed)
Pt received dinner meal tray. NPO starts at midnight

## 2023-05-26 NOTE — ED Notes (Signed)
MD at bedside. 

## 2023-05-26 NOTE — ED Provider Notes (Signed)
[facility] EMERGENCY DEPARTMENT AT [facility] Provider Note   CSN: [id number] Arrival date & time: 05/26/23  1534     History  Chief Complaint  Patient presents with   Abnormal ECG    Dustin Foster is a 89 y.o. male with PMH as listed below who presents from PCP with CHB with rates in 30s bpm.  He saw his doctor today because he states since Saturday he has felt very weak and tired, fatigued, and has had dyspnea on exertion.  Cannot walk more than 100 feet without getting short winded.  Denies any chest pain but states he feels, "swimmy headed," denies dizziness.  He also states he has had bilateral lower extremity edema that is mild for approximately 1 month.  Denies any abdominal pain, shortness of breath at rest, nausea vomiting, recent illnesses, fevers chills.  Does not take any beta-blockers or calcium channel blockers.  Has not ever had any arrhythmia before that he knows of.  No history of MI.  Went to PCP and was found to be in complete heart block, sent to the ED.   Past Medical History:  Diagnosis Date   Essential hypertension    Gout    Hyperlipidemia    Osteoarthritis    Type 2 diabetes mellitus (HCC)        Home Medications Prior to Admission medications   Medication Sig Start Date End Date Taking? Authorizing Provider  allopurinol (ZYLOPRIM) 300 MG tablet  06/03/22   [provider]  amLODipine (NORVASC) 2.5 MG tablet  04/18/22   [provider]  aspirin (ASPIRIN 81) 81 MG chewable tablet Chew 81 mg by mouth daily.    [provider]  silodosin (RAPAFLO) 8 MG CAPS capsule Take 1 capsule (8 mg total) by mouth daily with breakfast. 08/12/22   Stoneking, Danford Bad., MD      Allergies    Patient has no known allergies.    Review of Systems   Review of Systems A 10 point review of systems was performed and is negative unless otherwise reported in HPI.  Physical Exam Updated Vital Signs BP (!) 161/68   Pulse (!) 39   Temp  98.9 F (37.2 C) (Oral)   Resp 14   Ht 5\' 11"  (1.803 m)   Wt 115.2 kg   SpO2 94%   BMI 35.43 kg/m  Physical Exam General: Normal appearing obese male, lying in bed.  HEENT: Sclera anicteric, MMM, trachea midline.  Cardiology: Bradycardic regular rate/rhythm, no murmurs/rubs/gallops.  Resp: Normal respiratory rate and effort. CTAB, no wheezes, rhonchi, crackles.  Abd: Soft, non-tender, non-distended. No rebound tenderness or guarding.  GU: Deferred. MSK: 1+ pitting bilateral peripheral edema in LEs. No signs of trauma. Extremities without deformity or TTP.  Skin: warm, dry.  Neuro: A&Ox4, CNs II-XII grossly intact. MAEs. Sensation grossly intact.  Psych: Normal mood and affect.   ED Results / Procedures / Treatments   Labs (all labs ordered are listed, but only abnormal results are displayed) Labs Reviewed  BASIC METABOLIC PANEL - Abnormal; Notable for the following components:      Result Value   Chloride 112 (*)    BUN 30 (*)    Creatinine, Ser 1.35 (*)    GFR, Estimated 51 (*)    All other components within normal limits  CBC WITH DIFFERENTIAL/PLATELET  MAGNESIUM  TSH  T4, FREE  BRAIN NATRIURETIC PEPTIDE  TROPONIN I (HIGH SENSITIVITY)    EKG EKG Interpretation Date/Time:  Tuesday  May 26 2023 15:53:36 EDT Ventricular Rate:  40 PR Interval:    QRS Duration:  152 QT Interval:  609 QTC Calculation: 497 R Axis:   72  Text Interpretation: Complete AV block with wide QRS complex Paired ventricular premature complexes Right bundle branch block Confirmed by Vivi Barrack [phone]) on 05/26/2023 4:09:53 PM  Radiology DG Chest Portable 1 View  Result Date: 05/26/2023 CLINICAL DATA:  Dyspnea on exertion.  Complete heart block. EXAM: PORTABLE CHEST 1 VIEW COMPARISON:  None Available. FINDINGS: Cardiac enlargement. No vascular congestion. Emphysematous changes in the lungs. Linear interstitial changes in the lung bases may represent fibrosis or early edema. No pleural  effusions. No pneumothorax. No focal consolidation. Mediastinal contours appear intact. Degenerative changes in the spine and shoulders. IMPRESSION: 1. Cardiac enlargement. 2. Emphysematous changes in the lungs. 3. Mild linear interstitial changes in the lung bases could indicate fibrosis or early edema. Electronically Signed   By: Burman Nieves M.D.   On: 05/26/2023 16:43    Procedures Procedures    Medications Ordered in ED Medications  aspirin EC tablet 81 mg (has no administration in time range)  acetaminophen (TYLENOL) tablet 650 mg (has no administration in time range)  ondansetron (ZOFRAN) injection 4 mg (has no administration in time range)  heparin injection 5,000 Units (has no administration in time range)  amLODipine (NORVASC) tablet 2.5 mg (has no administration in time range)  tamsulosin (FLOMAX) capsule 0.4 mg (has no administration in time range)    ED Course/ Medical Decision Making/ A&P                          Medical Decision Making Amount and/or Complexity of Data Reviewed Labs: ordered. Decision-making details documented in ED Course. Radiology: ordered.  Risk Decision regarding hospitalization.    This patient presents to the ED for concern of fatigue/DOE/CHB, this involves an extensive number of treatment options, and is a complaint that carries with it a high risk of complications and morbidity.  I considered the following differential and admission for this acute, potentially life threatening condition.   MDM:    Pt presents fatigued and with DOE found to have CHB. EKG here confirms CHB with frequently PVCs. He denies CP and his BP is currently stable actually hypertensive, do not believe he requires transcutaneous pacing at this time. Consider cardiac ischemia, electrolyte abnormalities, thyroid abnormalities.  He does not take any calcium channel blockers, beta-blockers, digoxin.  Takes amlodipine, aspirin, allopurinol, silodosin.  With new lower  extremity over the last couple of months consider heart failure, pulmonary edema.  Will obtain labs/CXR and consult with cardiology.  Clinical Course as of 05/26/23 1656  Tue May 26, 2023  1609 CBC wnl [HN]  1610 D/w cards Dr. Diona Browner who states presuming non-reversible causes once labs come back, will likely need to go to [facility] for EP consult. [HN]  1654 Basic metabolic panel(!) No significant electrolyte or metabolic deranagemnt. No prior Cr for comparison, but it is mildly elevated. [HN]  1655 Magnesium: 2.2 [HN]  1655 Troponin I (High Sensitivity): 16 [HN]  1655 CXR w/ cardiomegaly, emphysematous changes, likely mild pulmonary edema as well. Pt with no hypoxia, SOB, resp distress. Will likely need diuresis at discretion of cardiology. Patient is admitted to cardiology for EP consult for CHB. BNP pending. BP stable at time of admission.  [HN]    Clinical Course User Index [HN] Loetta Rough, MD    Labs: I Ordered,  and personally interpreted labs.  The pertinent results include:  those listed above  Imaging Studies ordered: I ordered imaging studies including CXR I independently visualized and interpreted imaging. I agree with the radiologist interpretation  Additional history obtained from chart review, Dr. Rayburn Ma who called in.    Cardiac Monitoring: The patient was maintained on a cardiac monitor.  I personally viewed and interpreted the cardiac monitored which showed an underlying rhythm of: CHB with PVCs  Reevaluation: After the interventions noted above, I reevaluated the patient and found that they have :stayed the same  Social Determinants of Health: Lives independently   Disposition:  Admit  Co morbidities that complicate the patient evaluation  Past Medical History:  Diagnosis Date   Essential hypertension    Gout    Hyperlipidemia    Osteoarthritis    Type 2 diabetes mellitus (HCC)      Medicines Meds ordered this encounter  Medications   aspirin EC  tablet 81 mg   acetaminophen (TYLENOL) tablet 650 mg   ondansetron (ZOFRAN) injection 4 mg   heparin injection 5,000 Units   amLODipine (NORVASC) tablet 2.5 mg   tamsulosin (FLOMAX) capsule 0.4 mg    I have reviewed the patients home medicines and have made adjustments as needed  Problem List / ED Course: Problem List Items Addressed This Visit       Cardiovascular and Mediastinum   * (Principal) Complete heart block (HCC) - Primary   Relevant Medications   aspirin EC tablet 81 mg (Start on 05/27/2023 10:00 AM)   heparin injection 5,000 Units (Start on 05/26/2023 10:00 PM)   amLODipine (NORVASC) tablet 2.5 mg (Start on 05/26/2023  5:00 PM)                This note was created using dictation software, which may contain spelling or grammatical errors.    Loetta Rough, MD 05/26/23 [phone]

## 2023-05-26 NOTE — ED Triage Notes (Signed)
Pt brought over from Dr. Nile Riggs office with an EKG showing complete heart block with a rate of 35.  Pt reports he started to feel bad 3 days ago but feels better today then he has been.

## 2023-05-27 ENCOUNTER — Inpatient Hospital Stay (HOSPITAL_COMMUNITY)
Admission: EM | Disposition: A | Discharge status: Home / Self Care | Payer: Self-pay | Source: Ambulatory Visit | Attending: Internal Medicine

## 2023-05-27 ENCOUNTER — Inpatient Hospital Stay (HOSPITAL_COMMUNITY): Payer: Medicare HMO

## 2023-05-27 ENCOUNTER — Other Ambulatory Visit: Payer: Self-pay

## 2023-05-27 DIAGNOSIS — I442 Atrioventricular block, complete: Secondary | ICD-10-CM

## 2023-05-27 DIAGNOSIS — R9431 Abnormal electrocardiogram [ECG] [EKG]: Secondary | ICD-10-CM | POA: Diagnosis not present

## 2023-05-27 HISTORY — PX: PACEMAKER IMPLANT: EP1218

## 2023-05-27 LAB — ECHOCARDIOGRAM COMPLETE
AR max vel: 2.64 cm2
AV Area VTI: 2.76 cm2
AV Area mean vel: 2.57 cm2
AV Mean grad: 9 mmHg
AV Peak grad: 19.3 mmHg
Ao pk vel: 2.2 m/s
Area-P 1/2: 3.32 cm2
Calc EF: 67.9 %
Height: 71 in
S' Lateral: 3.2 cm
Single Plane A2C EF: 72.7 %
Single Plane A4C EF: 61.5 %
Weight: 4232.83 oz

## 2023-05-27 LAB — BASIC METABOLIC PANEL
Anion gap: 9 (ref 5–15)
BUN: 24 mg/dL — ABNORMAL HIGH (ref 8–23)
CO2: 20 mmol/L — ABNORMAL LOW (ref 22–32)
Calcium: 8.5 mg/dL — ABNORMAL LOW (ref 8.9–10.3)
Chloride: 111 mmol/L (ref 98–111)
Creatinine, Ser: 1.28 mg/dL — ABNORMAL HIGH (ref 0.61–1.24)
GFR, Estimated: 55 mL/min — ABNORMAL LOW (ref >60)
Glucose, Bld: 115 mg/dL — ABNORMAL HIGH (ref 70–99)
Potassium: 3.8 mmol/L (ref 3.5–5.1)
Sodium: 140 mmol/L (ref 135–145)

## 2023-05-27 LAB — CBC WITH DIFFERENTIAL/PLATELET
Abs Immature Granulocytes: 0.03 10*3/uL (ref 0.00–0.07)
Basophils Absolute: 0.1 10*3/uL (ref 0.0–0.1)
Basophils Relative: 1 %
Eosinophils Absolute: 0.2 10*3/uL (ref 0.0–0.5)
Eosinophils Relative: 3 %
HCT: 39.6 % (ref 39.0–52.0)
Hemoglobin: 13.1 g/dL (ref 13.0–17.0)
Immature Granulocytes: 0 %
Lymphocytes Relative: 27 %
Lymphs Abs: 1.9 10*3/uL (ref 0.7–4.0)
MCH: 30.8 pg (ref 26.0–34.0)
MCHC: 33.1 g/dL (ref 30.0–36.0)
MCV: 93.2 fL (ref 80.0–100.0)
Monocytes Absolute: 0.9 10*3/uL (ref 0.1–1.0)
Monocytes Relative: 12 %
Neutro Abs: 4 10*3/uL (ref 1.7–7.7)
Neutrophils Relative %: 57 %
Platelets: 175 10*3/uL (ref 150–400)
RBC: 4.25 MIL/uL (ref 4.22–5.81)
RDW: 15.2 % (ref 11.5–15.5)
WBC: 7.1 10*3/uL (ref 4.0–10.5)
nRBC: 0 % (ref 0.0–0.2)

## 2023-05-27 LAB — HEMOGLOBIN A1C
Hgb A1c MFr Bld: 6.3 % — ABNORMAL HIGH (ref 4.8–5.6)
Mean Plasma Glucose: 134.11 mg/dL

## 2023-05-27 LAB — TSH: TSH: 3.123 u[IU]/mL (ref 0.350–4.500)

## 2023-05-27 LAB — SURGICAL PCR SCREEN
MRSA, PCR: NEGATIVE
Staphylococcus aureus: NEGATIVE

## 2023-05-27 SURGERY — PACEMAKER IMPLANT

## 2023-05-27 MED ORDER — SODIUM CHLORIDE 0.9 % IV SOLN
80.0000 mg | INTRAVENOUS | Status: AC
  Administered 2023-05-27: 80 mg
  Filled 2023-05-27: qty 2

## 2023-05-27 MED ORDER — SODIUM CHLORIDE 0.9 % IV SOLN: INTRAVENOUS | Status: DC

## 2023-05-27 MED ORDER — HEPARIN (PORCINE) IN NACL 1000-0.9 UT/500ML-% IV SOLN
INTRAVENOUS | Status: DC | PRN
  Administered 2023-05-27: 500 mL

## 2023-05-27 MED ORDER — LIDOCAINE HCL (PF) 1 % IJ SOLN
INTRAMUSCULAR | Status: DC | PRN
  Administered 2023-05-27: 60 mL

## 2023-05-27 MED ORDER — ASPIRIN 81 MG PO TBEC
81.0000 mg | DELAYED_RELEASE_TABLET | Freq: Every day | ORAL | Status: DC
  Administered 2023-05-27: 81 mg via ORAL
  Filled 2023-05-27: qty 1

## 2023-05-27 MED ORDER — ACETAMINOPHEN 325 MG PO TABS
325.0000 mg | ORAL_TABLET | ORAL | Status: DC | PRN
  Administered 2023-05-28: 650 mg via ORAL
  Filled 2023-05-27: qty 2

## 2023-05-27 MED ORDER — SODIUM CHLORIDE 0.9% FLUSH: 3.0000 mL | INTRAVENOUS | Status: DC | PRN

## 2023-05-27 MED ORDER — CEFAZOLIN IN SODIUM CHLORIDE 3-0.9 GM/100ML-% IV SOLN
3.0000 g | INTRAVENOUS | Status: AC
  Administered 2023-05-27: 3 g via INTRAVENOUS
  Filled 2023-05-27: qty 100

## 2023-05-27 MED ORDER — LIDOCAINE HCL 1 % IJ SOLN
INTRAMUSCULAR | Status: AC
  Filled 2023-05-27: qty 60

## 2023-05-27 MED ORDER — SODIUM CHLORIDE 0.9% FLUSH
3.0000 mL | Freq: Two times a day (BID) | INTRAVENOUS | Status: DC
  Administered 2023-05-27 – 2023-05-28 (×2): 3 mL via INTRAVENOUS

## 2023-05-27 MED ORDER — CHLORHEXIDINE GLUCONATE 4 % EX SOLN
60.0000 mL | Freq: Once | CUTANEOUS | Status: DC
  Filled 2023-05-27: qty 60

## 2023-05-27 MED ORDER — AMLODIPINE BESYLATE 2.5 MG PO TABS
2.5000 mg | ORAL_TABLET | Freq: Every day | ORAL | Status: DC
  Administered 2023-05-27: 2.5 mg via ORAL
  Filled 2023-05-27: qty 1

## 2023-05-27 MED ORDER — SODIUM CHLORIDE 0.9 % IV SOLN: 250.0000 mL | INTRAVENOUS | Status: DC

## 2023-05-27 MED ORDER — MIDAZOLAM HCL 5 MG/5ML IJ SOLN
INTRAMUSCULAR | Status: DC | PRN
  Administered 2023-05-27: 1 mg via INTRAVENOUS

## 2023-05-27 MED ORDER — FENTANYL CITRATE (PF) 100 MCG/2ML IJ SOLN
INTRAMUSCULAR | Status: AC
  Filled 2023-05-27: qty 2

## 2023-05-27 MED ORDER — ONDANSETRON HCL 4 MG/2ML IJ SOLN: 4.0000 mg | Freq: Four times a day (QID) | INTRAMUSCULAR | Status: DC | PRN

## 2023-05-27 MED ORDER — TAMSULOSIN HCL 0.4 MG PO CAPS
0.4000 mg | ORAL_CAPSULE | Freq: Every day | ORAL | Status: DC
  Administered 2023-05-27: 0.4 mg via ORAL
  Filled 2023-05-27: qty 1

## 2023-05-27 MED ORDER — MIDAZOLAM HCL 5 MG/5ML IJ SOLN
INTRAMUSCULAR | Status: AC
  Filled 2023-05-27: qty 5

## 2023-05-27 MED ORDER — CEFAZOLIN SODIUM-DEXTROSE 2-4 GM/100ML-% IV SOLN
INTRAVENOUS | Status: AC
  Filled 2023-05-27: qty 100

## 2023-05-27 MED ORDER — FENTANYL CITRATE (PF) 100 MCG/2ML IJ SOLN
INTRAMUSCULAR | Status: DC | PRN
  Administered 2023-05-27: 25 ug via INTRAVENOUS

## 2023-05-27 MED ORDER — CEFAZOLIN SODIUM-DEXTROSE 1-4 GM/50ML-% IV SOLN
1.0000 g | Freq: Four times a day (QID) | INTRAVENOUS | Status: AC
  Administered 2023-05-27 – 2023-05-28 (×3): 1 g via INTRAVENOUS
  Filled 2023-05-27 (×4): qty 50

## 2023-05-27 SURGICAL SUPPLY — 13 items
CABLE SURGICAL S-101-97-12 (CABLE) ×2 IMPLANT
CATH RIGHTSITE C315HIS02 (CATHETERS) IMPLANT
IPG PACE AZUR XT DR MRI W1DR01 (Pacemaker) IMPLANT
KIT MICROPUNCTURE NIT STIFF (SHEATH) IMPLANT
LEAD CAPSURE NOVUS 5076-52CM (Lead) IMPLANT
LEAD SELECT SECURE 3830 383069 (Lead) IMPLANT
PACE AZURE XT DR MRI W1DR01 (Pacemaker) ×1 IMPLANT
PAD DEFIB RADIO PHYSIO CONN (PAD) ×2 IMPLANT
SELECT SECURE 3830 383069 (Lead) ×1 IMPLANT
SHEATH 7FR PRELUDE SNAP 13 (SHEATH) IMPLANT
SLITTER 6232ADJ (MISCELLANEOUS) IMPLANT
TRAY PACEMAKER INSERTION (PACKS) ×2 IMPLANT
WIRE HI TORQ VERSACORE-J 145CM (WIRE) IMPLANT

## 2023-05-27 NOTE — Plan of Care (Signed)

## 2023-05-27 NOTE — Consult Note (Addendum)
Cardiology Consultation   Patient ID: [name] MRN: [id number]; DOB: [birth year]  Admit date: 05/26/2023 Date of Consult: 05/27/2023  PCP:  Carylon Perches, MD   [facility] Providers Cardiologist:  None    Patient Profile:   Dustin Foster is a 89 y.o. male with a hx of Gout, HTN, HLD, OA, DM (diet managed) who is being seen 05/27/2023 for the evaluation of CHB at the request of Dr. Diona Browner.  History of Present Illness:   Dustin Foster without any particular cardiac hx was in his USOH until this past weekend, inset of unusual fatigue, lightheaded spells, no syncope, no CP/SOB, he sought attention by his PMD who found him bradycardic and referred him to the ER. At [facility] ER he was evaluated found in CHB without reversible causes and recommended/planned transfer to [facility] for EP evaluation and PPM.  LABS K+ 4.2 > 3.8 Mag 2.2 BNP 297 HS Trop 16 WBC 7.2 H/H 13/39 Plts 175 TSH 3.123  Home meds reviewed without any nodal blocking agents noted  He reports that he has felt a more rapid slowing this year, has aged more on the last 6 mo or so then he has in the last 5. Saturday though he had taken a nap, woke up feeling very unusual and had a sudden rush of noise and everything went black, fainted (seated), unclear for how long, "probably not long" and woke feeling OK-ish. Definitely more weak, tired then usual, but able to get around Saw his PMD yesterday and "here I am" At no time did he have any CP, headache, SOB, focal weakness    Past Medical History:  Diagnosis Date   Essential hypertension    Gout    Hyperlipidemia    Osteoarthritis    Type 2 diabetes mellitus (HCC)     Past Surgical History:  Procedure Laterality Date   BACK SURGERY     MELANOMA EXCISION       Home Medications:  Prior to Admission medications   Medication Sig Start Date End Date Taking? Authorizing Provider  allopurinol (ZYLOPRIM) 300 MG tablet Take 300 mg by mouth daily. 06/03/22  Yes  [provider]  amLODipine (NORVASC) 2.5 MG tablet  04/18/22  Yes [provider]  aspirin (ASPIRIN 81) 81 MG chewable tablet Chew 81 mg by mouth daily.   Yes [provider]  silodosin (RAPAFLO) 8 MG CAPS capsule Take 1 capsule (8 mg total) by mouth daily with breakfast. 08/12/22  Yes Stoneking, Danford Bad., MD    Inpatient Medications: Scheduled Meds:  amLODipine  2.5 mg Oral QHS   aspirin EC  81 mg Oral QHS   heparin  5,000 Units Subcutaneous Q8H   tamsulosin  0.4 mg Oral QHS   Continuous Infusions:  PRN Meds: acetaminophen, ondansetron (ZOFRAN) IV  Allergies:   No Known Allergies  Social History:   Social History   Socioeconomic History   Marital status: Single    Spouse name: Not on file   Number of children: Not on file   Years of education: Not on file   Highest education level: Not on file  Occupational History   Not on file  Tobacco Use   Smoking status: Former    Current packs/day: 0.00    Types: Cigarettes    Quit date: 1990    Years since quitting: 34.5   Smokeless tobacco: Never  Vaping Use   Vaping status: Never Used  Substance and Sexual Activity   Alcohol use:  Not Currently    Comment: every couple weeks   Drug use: Not Currently   Sexual activity: Not on file  Other Topics Concern   Not on file  Social History Narrative   Not on file   Social Determinants of Health   Financial Resource Strain: Not on file  Food Insecurity: No Food Insecurity (05/26/2023)   Hunger Vital Sign    Worried About Running Out of Food in the Last Year: Never true    Ran Out of Food in the Last Year: Never true  Transportation Needs: No Transportation Needs (05/26/2023)   PRAPARE - Administrator, Civil Service (Medical): No    Lack of Transportation (Non-Medical): No  Physical Activity: Not on file  Stress: Not on file  Social Connections: Not on file  Intimate Partner Violence: Not At Risk (05/26/2023)   Humiliation, Afraid,  Rape, and Kick questionnaire    Fear of Current or Ex-Partner: No    Emotionally Abused: No    Physically Abused: No    Sexually Abused: No    Family History:   Family History  Problem Relation Age of Onset   Arrhythmia Father      ROS:  Please see the history of present illness.  All other ROS reviewed and negative.     Physical Exam/Data:   Vitals:   05/26/23 1934 05/26/23 2330 05/27/23 0457 05/27/23 0500  BP: (!) 154/45 (!) 128/52 (!) 104/48   Pulse: (!) 39     Resp: 15 14 15    Temp: 97.9 F (36.6 C)  98 F (36.7 C)   TempSrc: Oral  Oral   SpO2: 97%     Weight: 120.2 kg   120 kg  Height: 5\' 11"  (1.803 m)      No intake or output data in the 24 hours ending 05/27/23 0922    05/27/2023    5:00 AM 05/26/2023    7:34 PM 05/26/2023    3:39 PM  Last 3 Weights  Weight (lbs) 264 lb 8.8 oz 264 lb 14.4 oz 254 lb  Weight (kg) 120 kg 120.158 kg 115.214 kg     Body mass index is 36.9 kg/m.  General:  Well nourished, well developed, in no acute distress HEENT: normal Neck: no JVD Vascular: No carotid bruits; Distal pulses 2+ bilaterally Cardiac:  RRR; bradycardic, no murmurs, gallops or rubs Lungs:  CTA b/l, no wheezing, rhonchi or rales  Abd: soft, nontender, obese Ext: no edema Musculoskeletal:  No deformities Skin: warm and dry  Neuro: no gross focal abnormalities noted Psych:  Normal affect   EKG:  The EKG was personally reviewed and demonstrates:    CHB 41bpm, RBBB CHB 40bp, RBBB, PVCs  No historical cardiac data  Telemetry:  Telemetry was personally reviewed and demonstrates:   CHB 30's   Relevant CV Studies:  Echo is ordered and pending  Laboratory Data:  High Sensitivity Troponin:   Recent Labs  Lab 05/26/23 1558  TROPONINIHS 16     Chemistry Recent Labs  Lab 05/26/23 1558 05/26/23 2357  NA 140 140  K 4.2 3.8  CL 112* 111  CO2 22 20*  GLUCOSE 98 115*  BUN 30* 24*  CREATININE 1.35* 1.28*  CALCIUM 8.9 8.5*  MG 2.2  --   GFRNONAA 51*  55*  ANIONGAP 6 9    No results for input(s): "PROT", "ALBUMIN", "AST", "ALT", "ALKPHOS", "BILITOT" in the last 168 hours. Lipids No results for input(s): "CHOL", "TRIG", "HDL", "LABVLDL", "  LDLCALC", "CHOLHDL" in the last 168 hours.  Hematology Recent Labs  Lab 05/26/23 1558 05/26/23 2357  WBC 7.7 7.1  RBC 4.54 4.25  HGB 13.9 13.1  HCT 41.8 39.6  MCV 92.1 93.2  MCH 30.6 30.8  MCHC 33.3 33.1  RDW 15.3 15.2  PLT 205 175   Thyroid  Recent Labs  Lab 05/26/23 1620 05/26/23 2357  TSH  --  3.123  FREET4 0.77  --     BNP Recent Labs  Lab 05/26/23 1544  BNP 297.0*    DDimer No results for input(s): "DDIMER" in the last 168 hours.   Radiology/Studies:  DG Chest Portable 1 View  Result Date: 05/26/2023 CLINICAL DATA:  Dyspnea on exertion.  Complete heart block. EXAM: PORTABLE CHEST 1 VIEW COMPARISON:  None Available. FINDINGS: Cardiac enlargement. No vascular congestion. Emphysematous changes in the lungs. Linear interstitial changes in the lung bases may represent fibrosis or early edema. No pleural effusions. No pneumothorax. No focal consolidation. Mediastinal contours appear intact. Degenerative changes in the spine and shoulders. IMPRESSION: 1. Cardiac enlargement. 2. Emphysematous changes in the lungs. 3. Mild linear interstitial changes in the lung bases could indicate fibrosis or early edema. Electronically Signed   By: Burman Nieves M.D.   On: 05/26/2023 16:43     Assessment and Plan:   CHB No reversible causes BP stable, asymptomatic at rest Will need PPM  Pending echo  Discussed rational for PPM,  implant procedure, and potential beneifts/risks, he is agreeable  EP MD will see him later this AM   Risk Assessment/Risk Scores:    For questions or updates, please contact [facility] Please consult [email or web address] for contact info under    Signed, Sheilah Pigeon, PA-C  05/27/2023 9:22 AM

## 2023-05-27 NOTE — Plan of Care (Signed)
°  Problem: Education: Goal: Understanding of cardiac disease, CV risk reduction, and recovery process will improve Outcome: Progressing Goal: Individualized Educational Video(s) Outcome: Progressing   Problem: Activity: Goal: Ability to tolerate increased activity will improve Outcome: Progressing   Problem: Cardiac: Goal: Ability to achieve and maintain adequate cardiovascular perfusion will improve Outcome: Progressing   Problem: Health Behavior/Discharge Planning: Goal: Ability to safely manage health-related needs after discharge will improve Outcome: Progressing   Problem: Education: Goal: Knowledge of General Education information will improve Description: Including pain rating scale, medication(s)/side effects and non-pharmacologic comfort measures Outcome: Progressing

## 2023-05-27 NOTE — Progress Notes (Signed)
  Echocardiogram 2D Echocardiogram has been performed.  Janalyn Harder 05/27/2023, 10:26 AM

## 2023-05-27 NOTE — Progress Notes (Signed)
   05/26/23 1934  Assess: MEWS Score  Temp 97.9 F (36.6 C)  BP (!) 154/45  MAP (mmHg) 77  Pulse Rate (!) 39  ECG Heart Rate (!) 39  Resp 15  SpO2 97 %  Assess: MEWS Score  MEWS Temp 0  MEWS Systolic 0  MEWS Pulse 2  MEWS RR 0  MEWS LOC 0  MEWS Score 2  MEWS Score Color Yellow  Assess: if the MEWS score is Yellow or Red  Were vital signs taken at a resting state? Yes  Focused Assessment No change from prior assessment  Does the patient meet 2 or more of the SIRS criteria? No  MEWS guidelines implemented  Yes, yellow  Treat  MEWS Interventions Considered administering scheduled or prn medications/treatments as ordered  Take Vital Signs  Increase Vital Sign Frequency  Yellow: Q2hr x1, continue Q4hrs until patient remains green for 12hrs  Escalate  MEWS: Escalate Yellow: Discuss with charge nurse and consider notifying provider and/or RRT  Notify: Charge Nurse/RN  Name of Charge Nurse/RN Notified Lydell Moga RN  Assess: SIRS CRITERIA  SIRS Temperature  0  SIRS Pulse 0  SIRS Respirations  0  SIRS WBC 0  SIRS Score Sum  0

## 2023-05-27 NOTE — Plan of Care (Signed)

## 2023-05-28 ENCOUNTER — Encounter (HOSPITAL_COMMUNITY): Payer: Self-pay | Admitting: Cardiovascular Disease

## 2023-05-28 ENCOUNTER — Inpatient Hospital Stay (HOSPITAL_COMMUNITY): Payer: Medicare HMO

## 2023-05-28 DIAGNOSIS — Z87891 Personal history of nicotine dependence: Secondary | ICD-10-CM | POA: Diagnosis not present

## 2023-05-28 DIAGNOSIS — I4510 Unspecified right bundle-branch block: Secondary | ICD-10-CM | POA: Diagnosis not present

## 2023-05-28 DIAGNOSIS — I119 Hypertensive heart disease without heart failure: Secondary | ICD-10-CM | POA: Diagnosis not present

## 2023-05-28 DIAGNOSIS — E782 Mixed hyperlipidemia: Secondary | ICD-10-CM | POA: Diagnosis not present

## 2023-05-28 DIAGNOSIS — Z85820 Personal history of malignant melanoma of skin: Secondary | ICD-10-CM | POA: Diagnosis not present

## 2023-05-28 DIAGNOSIS — E119 Type 2 diabetes mellitus without complications: Secondary | ICD-10-CM | POA: Diagnosis not present

## 2023-05-28 DIAGNOSIS — Z6835 Body mass index (BMI) 35.0-35.9, adult: Secondary | ICD-10-CM | POA: Diagnosis not present

## 2023-05-28 DIAGNOSIS — M1990 Unspecified osteoarthritis, unspecified site: Secondary | ICD-10-CM | POA: Diagnosis not present

## 2023-05-28 DIAGNOSIS — E669 Obesity, unspecified: Secondary | ICD-10-CM | POA: Diagnosis not present

## 2023-05-28 DIAGNOSIS — Z7982 Long term (current) use of aspirin: Secondary | ICD-10-CM | POA: Diagnosis not present

## 2023-05-28 DIAGNOSIS — Z79899 Other long term (current) drug therapy: Secondary | ICD-10-CM | POA: Diagnosis not present

## 2023-05-28 DIAGNOSIS — I442 Atrioventricular block, complete: Secondary | ICD-10-CM | POA: Diagnosis not present

## 2023-05-28 DIAGNOSIS — J939 Pneumothorax, unspecified: Secondary | ICD-10-CM | POA: Diagnosis not present

## 2023-05-28 MED FILL — Midazolam HCl Inj 2 MG/2ML (Base Equivalent): INTRAMUSCULAR | Qty: 1 | Status: AC

## 2023-05-28 MED FILL — Gentamicin Sulfate Inj 40 MG/ML: INTRAMUSCULAR | Qty: 80 | Status: AC

## 2023-05-28 NOTE — Progress Notes (Signed)
Patient discharged to home. AVS reviewed by Jefm Miles, RN

## 2023-05-28 NOTE — Discharge Summary (Signed)
DISCHARGE SUMMARY    Patient ID: Dustin Foster,  MRN: [id number], DOB/AGE: December 21, 1933 89 y.o.  Admit date: 05/26/2023 Discharge date: 05/28/2023  Primary Care Physician: Carylon Perches, MD  Primary Cardiologist: none Electrophysiologist: new, Dr. Nelly Laurence  Primary Discharge Diagnosis:  CHB  Secondary Discharge Diagnosis:  HTN HLD DM  Diet controlled  No Known Allergies   Procedures This Admission:  1.  Implantation of a MDT dual chamber PPM on 05/27/23 by Dr Nelly Laurence.   There were no immediate post procedure complications. CXR thism morning confirmed with radiologist, Dr. Dorothey Baseman, demonstrated no pneumothorax status post device implantation.   Brief HPI: Dustin Foster is a 89 y.o. male was referred to [facility] via his PMD with reports of sew weakness, probable syncope and found in CHB. With no reversible causes, transferred to [facility] for EP evaluation and consideration for [facility]  Hospital Course:  The patient was admitted, TTE done with preserved LVEF, labs largely unremarkable, and  and underwent implantation of a PPM with details as outlined in the procedure report.  He e was monitored on telemetry overnight which demonstrated SR/VP and AV pacing.  Left chest was without hematoma or ecchymosis.  The device was interrogated and found to be functioning normally.  CXR was obtained and demonstrated no pneumothorax status post device implantation.  Wound care, arm mobility, and restrictions were reviewed with the patient.  The patient feels well, denies any P/SOB, with minimal site discomfort.  He was examined by Dr. Nelly Laurence and considered stable for discharge to home.    Physical Exam: Vitals:   05/27/23 1847 05/27/23 1852 05/27/23 2035 05/28/23 0410  BP: 124/68 131/70 130/70 (!) 143/66  Pulse: 76 68 67 78  Resp: 14 (!) 23 16 20   Temp:   97.9 F (36.6 C) 97.9 F (36.6 C)  TempSrc:    Oral  SpO2: 100% 100% 100% 97%  Weight:    113.7 kg  Height:        GEN- The patient is  well appearing, alert and oriented x 3 today.   HEENT: normocephalic, atraumatic; sclera clear, conjunctiva pink; hearing intact; oropharynx clear; neck supple, no JVP Lungs-  CTA b/l, normal work of breathing.  No wheezes, rales, rhonchi Heart- RRR, no murmurs, rubs or gallops, PMI not laterally displaced GI- soft, non-tender, non-distended Extremities- no clubbing, cyanosis, or edema MS- no significant deformity or atrophy Skin- warm and dry, no rash or lesion, left chest without hematoma/ecchymosis Psych- euthymic mood, full affect Neuro- no gross deficits   Labs:   Lab Results  Component Value Date   WBC 7.1 05/26/2023   HGB 13.1 05/26/2023   HCT 39.6 05/26/2023   MCV 93.2 05/26/2023   PLT 175 05/26/2023    Recent Labs  Lab 05/26/23 2357  NA 140  K 3.8  CL 111  CO2 20*  BUN 24*  CREATININE 1.28*  CALCIUM 8.5*  GLUCOSE 115*    Discharge Medications:  Allergies as of 05/28/2023   No Known Allergies      Medication List     TAKE these medications    allopurinol 300 MG tablet Commonly known as: ZYLOPRIM Take 300 mg by mouth daily.   amLODipine 2.5 MG tablet Commonly known as: NORVASC   Aspirin 81 81 MG chewable tablet Generic drug: aspirin Chew 81 mg by mouth daily.   silodosin 8 MG Caps capsule Commonly known as: RAPAFLO Take 1 capsule (8 mg total) by mouth daily with breakfast.  Disposition: Home Discharge Instructions     Diet - low sodium heart healthy   Complete by: As directed    Increase activity slowly   Complete by: As directed         Duration of Discharge Encounter: Greater than 30 minutes including physician time.  Norma Fredrickson, PA-C 05/28/2023 8:53 AM

## 2023-05-28 NOTE — Discharge Instructions (Signed)
After Your Pacemaker   You have a Medtronic Pacemaker  ACTIVITY Do not lift your arm above shoulder height for 1 week after your procedure. After 7 days, you may progress as below.  You should remove your sling 24 hours after your procedure, unless otherwise instructed by your provider.     Thursday June 04, 2023  Friday June 05, 2023 Saturday June 06, 2023 Sunday June 07, 2023   Do not lift, push, pull, or carry anything over 10 pounds with the affected arm until 6 weeks (Thursday July 09, 2023 ) after your procedure.   You may drive AFTER your wound check, unless you have been told otherwise by your provider.   Ask your healthcare provider when you can go back to work   INCISION/Dressing  Monitor your Pacemaker site for redness, swelling, and drainage. Call the device clinic at [phone] if you experience these symptoms or fever/chills.  If your incision is sealed with Steri-strips or staples, you may shower 7 days after your procedure or when told by your provider. Do not remove the steri-strips or let the shower hit directly on your site. You may wash around your site with soap and water.    If you were discharged in a sling, please do not wear this during the day more than 48 hours after your surgery unless otherwise instructed. This may increase the risk of stiffness and soreness in your shoulder.   Avoid lotions, ointments, or perfumes over your incision until it is well-healed.  You may use a hot tub or a pool AFTER your wound check appointment if the incision is completely closed.  Pacemaker Alerts:  Some alerts are vibratory and others beep. These are NOT emergencies. Please call our office to let us know. If this occurs at night or on weekends, it can wait until the next business day. Send a remote transmission.  If your device is capable of reading fluid status (for heart failure), you will be offered monthly monitoring to review this with you.   DEVICE  MANAGEMENT Remote monitoring is used to monitor your pacemaker from home. This monitoring is scheduled every 91 days by our office. It allows [place] to keep an eye on the functioning of your device to ensure it is working properly. You will routinely see your Electrophysiologist annually (more often if necessary).   You should receive your ID card for your new device in 4-8 weeks. Keep this card with you at all times once received. Consider wearing a medical alert bracelet or necklace.  Your Pacemaker may be MRI compatible. This will be discussed at your next office visit/wound check.  You should avoid contact with strong electric or magnetic fields.   Do not use amateur (ham) radio equipment or electric (arc) welding torches. MP3 player headphones with magnets should not be used. Some devices are safe to use if held at least 12 inches (30 cm) from your Pacemaker. These include power tools, lawn mowers, and speakers. If you are unsure if something is safe to use, ask your health care provider.  When using your cell phone, hold it to the ear that is on the opposite side from the Pacemaker. Do not leave your cell phone in a pocket over the Pacemaker.  You may safely use electric blankets, heating pads, computers, and microwave ovens.  Call the office right away if: You have chest pain. You feel more short of breath than you have felt before. You feel more light-headed than you have  felt before. Your incision starts to open up.  This information is not intended to replace advice given to you by your health care provider. Make sure you discuss any questions you have with your health care provider.

## 2023-06-07 NOTE — Patient Instructions (Signed)
   After Your Pacemaker   Monitor your pacemaker site for redness, swelling, and drainage. Call the device clinic at [phone] if you experience these symptoms or fever/chills.  Your incision was closed with Steri-strips or staples:  You may shower 7 days after your procedure and wash your incision with soap and water. Avoid lotions, ointments, or perfumes over your incision until it is well-healed.  You may use a hot tub or a pool after your wound check appointment if the incision is completely closed.  Do not lift, push or pull greater than 10 pounds with the affected arm until 6 weeks after your procedure. UNTIL AFTER AUGUST 28TH. There are no other restrictions in arm movement after your wound check appointment.  You may drive, unless driving has been restricted by your healthcare providers.  Remote monitoring is used to monitor your pacemaker from home. This monitoring is scheduled every 91 days by our office. It allows [place] to keep an eye on the functioning of your device to ensure it is working properly. You will routinely see your Electrophysiologist annually (more often if necessary).

## 2023-06-08 ENCOUNTER — Ambulatory Visit: Payer: Medicare HMO | Attending: Cardiovascular Disease

## 2023-06-08 DIAGNOSIS — I442 Atrioventricular block, complete: Secondary | ICD-10-CM

## 2023-06-08 LAB — CUP PACEART INCLINIC DEVICE CHECK
Battery Remaining Longevity: 134 mo
Battery Voltage: 3.22 V
Brady Statistic AP VP Percent: 33.42 %
Brady Statistic AP VS Percent: 0 %
Brady Statistic AS VP Percent: 66.46 %
Brady Statistic AS VS Percent: 0.11 %
Brady Statistic RA Percent Paced: 32.91 %
Brady Statistic RV Percent Paced: 99.88 %
Date Time Interrogation Session: 20240729171933
Implantable Lead Connection Status: 753985
Implantable Lead Connection Status: 753985
Implantable Lead Implant Date: 20240717
Implantable Lead Implant Date: 20240717
Implantable Lead Location: 753859
Implantable Lead Location: 753860
Implantable Lead Model: 3830
Implantable Lead Model: 5076
Implantable Pulse Generator Implant Date: 20240717
Lead Channel Impedance Value: 304 Ohm
Lead Channel Impedance Value: 380 Ohm
Lead Channel Impedance Value: 475 Ohm
Lead Channel Impedance Value: 513 Ohm
Lead Channel Pacing Threshold Amplitude: 0.625 V
Lead Channel Pacing Threshold Amplitude: 0.625 V
Lead Channel Pacing Threshold Amplitude: 0.75 V
Lead Channel Pacing Threshold Amplitude: 0.75 V
Lead Channel Pacing Threshold Pulse Width: 0.4 ms
Lead Channel Pacing Threshold Pulse Width: 0.4 ms
Lead Channel Pacing Threshold Pulse Width: 0.4 ms
Lead Channel Pacing Threshold Pulse Width: 0.4 ms
Lead Channel Sensing Intrinsic Amplitude: 2.75 mV
Lead Channel Sensing Intrinsic Amplitude: 3.625 mV
Lead Channel Setting Pacing Amplitude: 3.5 V
Lead Channel Setting Pacing Amplitude: 3.5 V
Lead Channel Setting Pacing Pulse Width: 0.4 ms
Lead Channel Setting Sensing Sensitivity: 1.2 mV
Zone Setting Status: 755011

## 2023-06-08 NOTE — Progress Notes (Signed)
Wound check appointment. Steri-strips removed. Wound without redness or edema. Incision edges approximated, wound well healed. Normal device function. Thresholds, sensing, and impedances consistent with implant measurements. Device programmed at 3.5V/auto capture programmed on for extra safety margin until 3 month visit. Histogram distribution appropriate for patient and level of activity. AT/AF BURDEN <.1%, no high ventricular rates noted. Patient educated about wound care, arm mobility, lifting restrictions. ROV in 3 months with implanting physician.

## 2023-06-10 ENCOUNTER — Telehealth: Payer: Self-pay | Admitting: Cardiovascular Disease

## 2023-06-10 NOTE — Telephone Encounter (Signed)
[facility] office calling to see if patient needs a pre-med for his dental cleaning. Please advise.

## 2023-06-11 NOTE — Telephone Encounter (Signed)
Attempted to reach dental office -- they are only open Mon - Wed Forwarding to [employer] nurse to call next week and let them know

## 2023-06-15 ENCOUNTER — Telehealth: Payer: Self-pay | Admitting: Cardiovascular Disease

## 2023-06-15 NOTE — Telephone Encounter (Signed)
   Pre-operative Risk Assessment    Patient Name: Dustin Foster  DOB: 1934-10-17 MRN: [id number]      Request for Surgical Clearance    Procedure:   Cavity Fix and Routine Cleaning  Date of Surgery:  Clearance TBD                                 Surgeon:  Farrell Ours Surgeon's Group or Practice Name:  [facility] Phone number:  [phone] Fax number:  [phone]   Type of Clearance Requested:      Type of Anesthesia:  Local    Additional requests/questions:  Does this patient need antibiotics?  Signed, Belisicia T Woods   06/15/2023, 9:09 AM

## 2023-06-15 NOTE — Telephone Encounter (Signed)
   Name: Dustin Foster  DOB: 1934-07-26  MRN: [id number]   Primary Cardiologist: None  Patient does not require SBE for dental work. However, ideally patient will need to wait 6 weeks from [facility] implant date to undergo dental work to limit risk of infection. Pacemaker was implanted on 05/27/2023.  I will route this recommendation to the requesting party via Epic fax function and remove from pre-op pool. Please call with questions.  Carlos Levering, NP 06/15/2023, 4:56 PM

## 2023-06-16 NOTE — Telephone Encounter (Signed)
Left message with dental office - medication prior to dental visit isn't needed.

## 2023-07-29 DIAGNOSIS — M1000 Idiopathic gout, unspecified site: Secondary | ICD-10-CM | POA: Diagnosis not present

## 2023-07-29 DIAGNOSIS — E1129 Type 2 diabetes mellitus with other diabetic kidney complication: Secondary | ICD-10-CM | POA: Diagnosis not present

## 2023-07-29 DIAGNOSIS — N400 Enlarged prostate without lower urinary tract symptoms: Secondary | ICD-10-CM | POA: Diagnosis not present

## 2023-07-29 DIAGNOSIS — I10 Essential (primary) hypertension: Secondary | ICD-10-CM | POA: Diagnosis not present

## 2023-07-29 DIAGNOSIS — N1831 Chronic kidney disease, stage 3a: Secondary | ICD-10-CM | POA: Diagnosis not present

## 2023-07-29 DIAGNOSIS — Z79899 Other long term (current) drug therapy: Secondary | ICD-10-CM | POA: Diagnosis not present

## 2023-07-30 ENCOUNTER — Other Ambulatory Visit: Payer: Self-pay | Admitting: Urology

## 2023-07-30 DIAGNOSIS — N138 Other obstructive and reflux uropathy: Secondary | ICD-10-CM

## 2023-08-03 ENCOUNTER — Other Ambulatory Visit: Payer: Self-pay

## 2023-08-03 DIAGNOSIS — R350 Frequency of micturition: Secondary | ICD-10-CM

## 2023-08-03 MED ORDER — SILODOSIN 8 MG PO CAPS
8.0000 mg | ORAL_CAPSULE | Freq: Every day | ORAL | 11 refills | Status: DC
Start: 2023-08-03 — End: 2024-08-02

## 2023-08-27 ENCOUNTER — Ambulatory Visit: Payer: Medicare HMO | Attending: Cardiovascular Disease

## 2023-08-27 DIAGNOSIS — I442 Atrioventricular block, complete: Secondary | ICD-10-CM

## 2023-08-27 LAB — CUP PACEART REMOTE DEVICE CHECK
Battery Remaining Longevity: 147 mo
Battery Voltage: 3.2 V
Brady Statistic AP VP Percent: 23.71 %
Brady Statistic AP VS Percent: 0 %
Brady Statistic AS VP Percent: 76.17 %
Brady Statistic AS VS Percent: 0.12 %
Brady Statistic RA Percent Paced: 23.38 %
Brady Statistic RV Percent Paced: 99.88 %
Date Time Interrogation Session: 20241017045116
Implantable Lead Connection Status: 753985
Implantable Lead Connection Status: 753985
Implantable Lead Implant Date: 20240717
Implantable Lead Implant Date: 20240717
Implantable Lead Location: 753859
Implantable Lead Location: 753860
Implantable Lead Model: 3830
Implantable Lead Model: 5076
Implantable Pulse Generator Implant Date: 20240717
Lead Channel Impedance Value: 304 Ohm
Lead Channel Impedance Value: 342 Ohm
Lead Channel Impedance Value: 380 Ohm
Lead Channel Impedance Value: 475 Ohm
Lead Channel Pacing Threshold Amplitude: 0.625 V
Lead Channel Pacing Threshold Amplitude: 0.75 V
Lead Channel Pacing Threshold Pulse Width: 0.4 ms
Lead Channel Pacing Threshold Pulse Width: 0.4 ms
Lead Channel Sensing Intrinsic Amplitude: 3.5 mV
Lead Channel Sensing Intrinsic Amplitude: 3.5 mV
Lead Channel Setting Pacing Amplitude: 1.5 V
Lead Channel Setting Pacing Amplitude: 2 V
Lead Channel Setting Pacing Pulse Width: 0.4 ms
Lead Channel Setting Sensing Sensitivity: 1.2 mV
Zone Setting Status: 755011

## 2023-08-31 ENCOUNTER — Encounter: Payer: Medicare HMO | Admitting: Cardiovascular Disease

## 2023-09-01 DIAGNOSIS — B351 Tinea unguium: Secondary | ICD-10-CM | POA: Diagnosis not present

## 2023-09-01 DIAGNOSIS — M79676 Pain in unspecified toe(s): Secondary | ICD-10-CM | POA: Diagnosis not present

## 2023-09-15 NOTE — Progress Notes (Signed)
Remote pacemaker transmission.   

## 2023-09-18 ENCOUNTER — Ambulatory Visit: Payer: Medicare HMO | Attending: Cardiovascular Disease | Admitting: Cardiovascular Disease

## 2023-09-18 ENCOUNTER — Encounter: Payer: Self-pay | Admitting: Cardiovascular Disease

## 2023-09-18 VITALS — BP 130/78 | HR 72 | Ht 71.0 in | Wt 263.8 lb

## 2023-09-18 DIAGNOSIS — I442 Atrioventricular block, complete: Secondary | ICD-10-CM | POA: Diagnosis not present

## 2023-09-18 NOTE — Patient Instructions (Addendum)

## 2023-09-18 NOTE — Progress Notes (Signed)
  Electrophysiology Office Note:    Date:  09/18/2023   ID:  Dustin Foster, DOB 03-Jun-1934, MRN [id number]  PCP:  Carylon Perches, MD   [facility] Providers Cardiologist:  None Electrophysiologist:  Maurice Small, MD     Referring MD: Carylon Perches, MD   History of Present Illness:    Dustin Foster is a 89 y.o. male with a medical history significant for hypertension, hyperlipidemia, diet-controlled diabetes who presents for electrophysiology follow-up.     He was admitted to [facility] in July 2024 with complete heart block.  A Medtronic dual-chamber pacemaker was placed on May 27, 2023       Today, he has no complaints. he has no device related complaints -- no new tenderness, drainage, redness.   EKGs/Labs/Other Studies Reviewed Today:     Echocardiogram:  TTE July 2024 EF 55 to 60%.  Mildly dilated right atrium.    EKG:         Physical Exam:    VS:  BP 130/78 (BP Location: Left Arm)   Pulse 72   Ht 5\' 11"  (1.803 m)   Wt 263 lb 12.8 oz (119.7 kg)   SpO2 95%   BMI 36.79 kg/m     Wt Readings from Last 3 Encounters:  09/18/23 263 lb 12.8 oz (119.7 kg)  05/28/23 250 lb 11.2 oz (113.7 kg)  01/27/23 254 lb (115.2 kg)     GEN: Well nourished, well developed in no acute distress CARDIAC: RRR, no murmurs, rubs, gallops The device site is normal -- no tenderness, edema, drainage, redness, threatened erosion.  RESPIRATORY:  Normal work of breathing MUSCULOSKELETAL: no edema    ASSESSMENT & PLAN:     Complete heart block Medtronic dual-chamber pacemaker in place I reviewed today's device interrogation.  See Paceart for details He is device dependent today  NSVT Device-detected Brief, asymptomatic Normal EF Continue to monitor  Hypertension No changes today   Signed, Maurice Small, MD  09/18/2023 12:24 PM    [facility]

## 2023-09-29 DIAGNOSIS — L570 Actinic keratosis: Secondary | ICD-10-CM | POA: Diagnosis not present

## 2023-11-17 DIAGNOSIS — B351 Tinea unguium: Secondary | ICD-10-CM | POA: Diagnosis not present

## 2023-11-17 DIAGNOSIS — M79676 Pain in unspecified toe(s): Secondary | ICD-10-CM | POA: Diagnosis not present

## 2023-11-26 ENCOUNTER — Ambulatory Visit (INDEPENDENT_AMBULATORY_CARE_PROVIDER_SITE_OTHER): Payer: Medicare HMO

## 2023-11-26 DIAGNOSIS — I442 Atrioventricular block, complete: Secondary | ICD-10-CM

## 2023-11-27 LAB — CUP PACEART REMOTE DEVICE CHECK
Battery Remaining Longevity: 144 mo
Battery Voltage: 3.16 V
Brady Statistic AP VP Percent: 27.87 %
Brady Statistic AP VS Percent: 0 %
Brady Statistic AS VP Percent: 72.03 %
Brady Statistic AS VS Percent: 0.09 %
Brady Statistic RA Percent Paced: 27.45 %
Brady Statistic RV Percent Paced: 99.9 %
Date Time Interrogation Session: 20250116082008
Implantable Lead Connection Status: 753985
Implantable Lead Connection Status: 753985
Implantable Lead Implant Date: 20240717
Implantable Lead Implant Date: 20240717
Implantable Lead Location: 753859
Implantable Lead Location: 753860
Implantable Lead Model: 3830
Implantable Lead Model: 5076
Implantable Pulse Generator Implant Date: 20240717
Lead Channel Impedance Value: 304 Ohm
Lead Channel Impedance Value: 361 Ohm
Lead Channel Impedance Value: 437 Ohm
Lead Channel Impedance Value: 475 Ohm
Lead Channel Pacing Threshold Amplitude: 0.625 V
Lead Channel Pacing Threshold Amplitude: 0.75 V
Lead Channel Pacing Threshold Pulse Width: 0.4 ms
Lead Channel Pacing Threshold Pulse Width: 0.4 ms
Lead Channel Sensing Intrinsic Amplitude: 2.25 mV
Lead Channel Sensing Intrinsic Amplitude: 2.25 mV
Lead Channel Setting Pacing Amplitude: 1.5 V
Lead Channel Setting Pacing Amplitude: 2 V
Lead Channel Setting Pacing Pulse Width: 0.4 ms
Lead Channel Setting Sensing Sensitivity: 1.2 mV
Zone Setting Status: 755011

## 2023-12-05 ENCOUNTER — Encounter: Payer: Self-pay | Admitting: Cardiovascular Disease

## 2024-01-04 NOTE — Addendum Note (Signed)
 Addended by: Elease Etienne A on: 01/04/2024 09:03 AM   Modules accepted: Orders

## 2024-01-04 NOTE — Progress Notes (Signed)
 Remote pacemaker transmission.

## 2024-01-21 DIAGNOSIS — Z87891 Personal history of nicotine dependence: Secondary | ICD-10-CM | POA: Diagnosis not present

## 2024-01-21 DIAGNOSIS — E1122 Type 2 diabetes mellitus with diabetic chronic kidney disease: Secondary | ICD-10-CM | POA: Diagnosis not present

## 2024-01-21 DIAGNOSIS — N400 Enlarged prostate without lower urinary tract symptoms: Secondary | ICD-10-CM | POA: Diagnosis not present

## 2024-01-21 DIAGNOSIS — E785 Hyperlipidemia, unspecified: Secondary | ICD-10-CM | POA: Diagnosis not present

## 2024-01-21 DIAGNOSIS — Z008 Encounter for other general examination: Secondary | ICD-10-CM | POA: Diagnosis not present

## 2024-01-21 DIAGNOSIS — M109 Gout, unspecified: Secondary | ICD-10-CM | POA: Diagnosis not present

## 2024-01-21 DIAGNOSIS — Z8249 Family history of ischemic heart disease and other diseases of the circulatory system: Secondary | ICD-10-CM | POA: Diagnosis not present

## 2024-01-21 DIAGNOSIS — E1142 Type 2 diabetes mellitus with diabetic polyneuropathy: Secondary | ICD-10-CM | POA: Diagnosis not present

## 2024-01-21 DIAGNOSIS — I129 Hypertensive chronic kidney disease with stage 1 through stage 4 chronic kidney disease, or unspecified chronic kidney disease: Secondary | ICD-10-CM | POA: Diagnosis not present

## 2024-01-21 DIAGNOSIS — Z809 Family history of malignant neoplasm, unspecified: Secondary | ICD-10-CM | POA: Diagnosis not present

## 2024-01-21 DIAGNOSIS — N1831 Chronic kidney disease, stage 3a: Secondary | ICD-10-CM | POA: Diagnosis not present

## 2024-01-21 DIAGNOSIS — Z833 Family history of diabetes mellitus: Secondary | ICD-10-CM | POA: Diagnosis not present

## 2024-01-26 DIAGNOSIS — M1000 Idiopathic gout, unspecified site: Secondary | ICD-10-CM | POA: Diagnosis not present

## 2024-01-26 DIAGNOSIS — N1831 Chronic kidney disease, stage 3a: Secondary | ICD-10-CM | POA: Diagnosis not present

## 2024-01-26 DIAGNOSIS — I10 Essential (primary) hypertension: Secondary | ICD-10-CM | POA: Diagnosis not present

## 2024-01-26 DIAGNOSIS — I442 Atrioventricular block, complete: Secondary | ICD-10-CM | POA: Diagnosis not present

## 2024-01-26 DIAGNOSIS — Z79899 Other long term (current) drug therapy: Secondary | ICD-10-CM | POA: Diagnosis not present

## 2024-01-26 DIAGNOSIS — E1129 Type 2 diabetes mellitus with other diabetic kidney complication: Secondary | ICD-10-CM | POA: Diagnosis not present

## 2024-02-25 ENCOUNTER — Ambulatory Visit (INDEPENDENT_AMBULATORY_CARE_PROVIDER_SITE_OTHER): Payer: Medicare HMO

## 2024-02-25 DIAGNOSIS — I442 Atrioventricular block, complete: Secondary | ICD-10-CM

## 2024-02-26 LAB — CUP PACEART REMOTE DEVICE CHECK
Battery Remaining Longevity: 142 mo
Battery Voltage: 3.11 V
Brady Statistic AP VP Percent: 27.74 %
Brady Statistic AP VS Percent: 0 %
Brady Statistic AS VP Percent: 72.18 %
Brady Statistic AS VS Percent: 0.07 %
Brady Statistic RA Percent Paced: 27.31 %
Brady Statistic RV Percent Paced: 99.92 %
Date Time Interrogation Session: 20250417111154
Implantable Lead Connection Status: 753985
Implantable Lead Connection Status: 753985
Implantable Lead Implant Date: 20240717
Implantable Lead Implant Date: 20240717
Implantable Lead Location: 753859
Implantable Lead Location: 753860
Implantable Lead Model: 3830
Implantable Lead Model: 5076
Implantable Pulse Generator Implant Date: 20240717
Lead Channel Impedance Value: 304 Ohm
Lead Channel Impedance Value: 361 Ohm
Lead Channel Impedance Value: 475 Ohm
Lead Channel Impedance Value: 494 Ohm
Lead Channel Pacing Threshold Amplitude: 0.75 V
Lead Channel Pacing Threshold Amplitude: 0.875 V
Lead Channel Pacing Threshold Pulse Width: 0.4 ms
Lead Channel Pacing Threshold Pulse Width: 0.4 ms
Lead Channel Sensing Intrinsic Amplitude: 3.5 mV
Lead Channel Sensing Intrinsic Amplitude: 3.5 mV
Lead Channel Setting Pacing Amplitude: 1.5 V
Lead Channel Setting Pacing Amplitude: 2 V
Lead Channel Setting Pacing Pulse Width: 0.4 ms
Lead Channel Setting Sensing Sensitivity: 1.2 mV
Zone Setting Status: 755011

## 2024-03-10 ENCOUNTER — Encounter: Payer: Self-pay | Admitting: Cardiovascular Disease

## 2024-04-05 DIAGNOSIS — M79674 Pain in right toe(s): Secondary | ICD-10-CM | POA: Diagnosis not present

## 2024-04-05 DIAGNOSIS — M79675 Pain in left toe(s): Secondary | ICD-10-CM | POA: Diagnosis not present

## 2024-04-05 DIAGNOSIS — B351 Tinea unguium: Secondary | ICD-10-CM | POA: Diagnosis not present

## 2024-04-06 NOTE — Addendum Note (Signed)
 Addended by: Lott Rouleau A on: 04/06/2024 10:11 AM   Modules accepted: Orders

## 2024-04-06 NOTE — Progress Notes (Signed)
 Remote pacemaker transmission.

## 2024-05-09 NOTE — Progress Notes (Signed)
 Impression/Assessment:  History of recurrent urinary tract infections, now sent in free after being on suppression for a few months  BPH with symptoms, off of Myrbetriq .  On silodosin .  Plan:    History of Present Illness: This returns for further mgmt of recurrent UTIs w/ same Klebsiella aerogenes organism. At last visit he was put on 3 mos of suppressive Rx w/ methenamine .  3.19.2024: He is still on methenamine  hippurate twice a day as well as Myrbetriq  and silodosin .  Voiding adequately.  No recent urinary tract infections.  6.25.2024: Here for routine check.  He has been off methenamine  has not been on antibiotics recently.  He is happy with his voiding.  He has had no gross hematuria or dysuria.  He is also off of Myrbetriq .  7.1.2025:  Past Medical History:  Diagnosis Date   Essential hypertension    Gout    Hyperlipidemia    Osteoarthritis    Type 2 diabetes mellitus (HCC)     Past Surgical History:  Procedure Laterality Date   BACK SURGERY     MELANOMA EXCISION     PACEMAKER IMPLANT N/A 05/27/2023   Procedure: PACEMAKER IMPLANT;  Surgeon: Nancey Eulas BRAVO, MD;  Location: [facility];  Service: Cardiovascular;  Laterality: N/A;    Home Medications:  Allergies as of 05/10/2024   No Known Allergies      Medication List        Accurate as of May 09, 2024 12:43 PM. If you have any questions, ask your nurse or doctor.          allopurinol 300 MG tablet Commonly known as: ZYLOPRIM Take 300 mg by mouth daily.   amLODipine  2.5 MG tablet Commonly known as: NORVASC    Aspirin  81 81 MG chewable tablet Generic drug: aspirin  Chew 81 mg by mouth daily.   silodosin  8 MG Caps capsule Commonly known as: RAPAFLO  Take 1 capsule (8 mg total) by mouth daily with breakfast.        Allergies: No Known Allergies  Family History  Problem Relation Age of Onset   Arrhythmia Father     Social History:  reports that he quit smoking about 35 years ago. His  smoking use included cigarettes. He has never used smokeless tobacco. He reports that he does not currently use alcohol. He reports that he does not currently use drugs.  ROS: A complete review of systems was performed.  All systems are negative except for pertinent findings as noted.  Physical Exam:  Vital signs in last 24 hours: There were no vitals taken for this visit. Constitutional:  Alert and oriented, No acute distress Cardiovascular: Regular rate  Respiratory: Normal respiratory effort Neurologic: Grossly intact, no focal deficits Psychiatric: Normal mood and affect  I have reviewed prior pt notes  I have reviewed urinalysis results  I have independently reviewed prior imaging  I have reviewed prior urine culture

## 2024-05-10 ENCOUNTER — Ambulatory Visit: Payer: Medicare HMO | Admitting: Urology

## 2024-05-10 ENCOUNTER — Encounter: Payer: Self-pay | Admitting: Urology

## 2024-05-10 VITALS — BP 126/70 | HR 73

## 2024-05-10 DIAGNOSIS — Z87440 Personal history of urinary (tract) infections: Secondary | ICD-10-CM

## 2024-05-10 DIAGNOSIS — N401 Enlarged prostate with lower urinary tract symptoms: Secondary | ICD-10-CM | POA: Diagnosis not present

## 2024-05-10 DIAGNOSIS — N138 Other obstructive and reflux uropathy: Secondary | ICD-10-CM

## 2024-05-10 DIAGNOSIS — R350 Frequency of micturition: Secondary | ICD-10-CM | POA: Diagnosis not present

## 2024-05-10 LAB — MICROSCOPIC EXAMINATION
Bacteria, UA: NONE SEEN
WBC, UA: NONE SEEN /HPF (ref 0–5)

## 2024-05-10 LAB — URINALYSIS, ROUTINE W REFLEX MICROSCOPIC
Bilirubin, UA: NEGATIVE
Glucose, UA: NEGATIVE
Ketones, UA: NEGATIVE
Leukocytes,UA: NEGATIVE
Nitrite, UA: NEGATIVE
Protein,UA: NEGATIVE
Specific Gravity, UA: 1.015 (ref 1.005–1.030)
Urobilinogen, Ur: 0.2 mg/dL (ref 0.2–1.0)
pH, UA: 6 (ref 5.0–7.5)

## 2024-05-10 LAB — BLADDER SCAN AMB NON-IMAGING: Scan Result: 15

## 2024-05-10 NOTE — Progress Notes (Signed)
 Bladder Scan completed today.  Patient can void prior to the bladder scan. Bladder scan result: 15  Performed By: Exie DASEN. CMA

## 2024-05-26 ENCOUNTER — Ambulatory Visit: Payer: Medicare HMO

## 2024-05-26 DIAGNOSIS — I442 Atrioventricular block, complete: Secondary | ICD-10-CM

## 2024-05-27 LAB — CUP PACEART REMOTE DEVICE CHECK
Battery Remaining Longevity: 139 mo
Battery Voltage: 3.06 V
Brady Statistic AP VP Percent: 29.21 %
Brady Statistic AP VS Percent: 0 %
Brady Statistic AS VP Percent: 70.73 %
Brady Statistic AS VS Percent: 0.06 %
Brady Statistic RA Percent Paced: 28.83 %
Brady Statistic RV Percent Paced: 99.94 %
Date Time Interrogation Session: 20250717074509
Implantable Lead Connection Status: 753985
Implantable Lead Connection Status: 753985
Implantable Lead Implant Date: 20240717
Implantable Lead Implant Date: 20240717
Implantable Lead Location: 753859
Implantable Lead Location: 753860
Implantable Lead Model: 3830
Implantable Lead Model: 5076
Implantable Pulse Generator Implant Date: 20240717
Lead Channel Impedance Value: 304 Ohm
Lead Channel Impedance Value: 380 Ohm
Lead Channel Impedance Value: 399 Ohm
Lead Channel Impedance Value: 494 Ohm
Lead Channel Pacing Threshold Amplitude: 0.625 V
Lead Channel Pacing Threshold Amplitude: 0.75 V
Lead Channel Pacing Threshold Pulse Width: 0.4 ms
Lead Channel Pacing Threshold Pulse Width: 0.4 ms
Lead Channel Sensing Intrinsic Amplitude: 3.625 mV
Lead Channel Sensing Intrinsic Amplitude: 3.625 mV
Lead Channel Sensing Intrinsic Amplitude: 8.625 mV
Lead Channel Sensing Intrinsic Amplitude: 8.625 mV
Lead Channel Setting Pacing Amplitude: 1.5 V
Lead Channel Setting Pacing Amplitude: 2 V
Lead Channel Setting Pacing Pulse Width: 0.4 ms
Lead Channel Setting Sensing Sensitivity: 1.2 mV
Zone Setting Status: 755011

## 2024-06-04 ENCOUNTER — Ambulatory Visit: Payer: Self-pay | Admitting: Cardiovascular Disease

## 2024-06-14 DIAGNOSIS — M79674 Pain in right toe(s): Secondary | ICD-10-CM | POA: Diagnosis not present

## 2024-06-14 DIAGNOSIS — B351 Tinea unguium: Secondary | ICD-10-CM | POA: Diagnosis not present

## 2024-06-14 DIAGNOSIS — M79675 Pain in left toe(s): Secondary | ICD-10-CM | POA: Diagnosis not present

## 2024-07-27 ENCOUNTER — Other Ambulatory Visit: Payer: Self-pay | Admitting: Urology

## 2024-07-27 DIAGNOSIS — R350 Frequency of micturition: Secondary | ICD-10-CM

## 2024-07-28 DIAGNOSIS — E1129 Type 2 diabetes mellitus with other diabetic kidney complication: Secondary | ICD-10-CM | POA: Diagnosis not present

## 2024-07-28 DIAGNOSIS — I442 Atrioventricular block, complete: Secondary | ICD-10-CM | POA: Diagnosis not present

## 2024-07-28 DIAGNOSIS — N1831 Chronic kidney disease, stage 3a: Secondary | ICD-10-CM | POA: Diagnosis not present

## 2024-07-28 DIAGNOSIS — Z79899 Other long term (current) drug therapy: Secondary | ICD-10-CM | POA: Diagnosis not present

## 2024-08-04 DIAGNOSIS — Z23 Encounter for immunization: Secondary | ICD-10-CM | POA: Diagnosis not present

## 2024-08-04 DIAGNOSIS — N183 Chronic kidney disease, stage 3 unspecified: Secondary | ICD-10-CM | POA: Diagnosis not present

## 2024-08-04 DIAGNOSIS — N400 Enlarged prostate without lower urinary tract symptoms: Secondary | ICD-10-CM | POA: Diagnosis not present

## 2024-08-04 DIAGNOSIS — E1122 Type 2 diabetes mellitus with diabetic chronic kidney disease: Secondary | ICD-10-CM | POA: Diagnosis not present

## 2024-08-16 NOTE — Progress Notes (Signed)
 Remote PPM Transmission

## 2024-08-25 ENCOUNTER — Ambulatory Visit: Payer: Medicare HMO

## 2024-08-25 DIAGNOSIS — I442 Atrioventricular block, complete: Secondary | ICD-10-CM

## 2024-08-26 LAB — CUP PACEART REMOTE DEVICE CHECK
Battery Remaining Longevity: 135 mo
Battery Voltage: 3.04 V
Brady Statistic AP VP Percent: 30.29 %
Brady Statistic AP VS Percent: 0 %
Brady Statistic AS VP Percent: 69.42 %
Brady Statistic AS VS Percent: 0.29 %
Brady Statistic RA Percent Paced: 29.93 %
Brady Statistic RV Percent Paced: 99.71 %
Date Time Interrogation Session: 20251016111141
Implantable Lead Connection Status: 753985
Implantable Lead Connection Status: 753985
Implantable Lead Implant Date: 20240717
Implantable Lead Implant Date: 20240717
Implantable Lead Location: 753859
Implantable Lead Location: 753860
Implantable Lead Model: 3830
Implantable Lead Model: 5076
Implantable Pulse Generator Implant Date: 20240717
Lead Channel Impedance Value: 285 Ohm
Lead Channel Impedance Value: 361 Ohm
Lead Channel Impedance Value: 399 Ohm
Lead Channel Impedance Value: 475 Ohm
Lead Channel Pacing Threshold Amplitude: 0.625 V
Lead Channel Pacing Threshold Amplitude: 0.75 V
Lead Channel Pacing Threshold Pulse Width: 0.4 ms
Lead Channel Pacing Threshold Pulse Width: 0.4 ms
Lead Channel Sensing Intrinsic Amplitude: 1.875 mV
Lead Channel Sensing Intrinsic Amplitude: 1.875 mV
Lead Channel Sensing Intrinsic Amplitude: 5.25 mV
Lead Channel Sensing Intrinsic Amplitude: 5.25 mV
Lead Channel Setting Pacing Amplitude: 1.5 V
Lead Channel Setting Pacing Amplitude: 2 V
Lead Channel Setting Pacing Pulse Width: 0.4 ms
Lead Channel Setting Sensing Sensitivity: 1.2 mV
Zone Setting Status: 755011

## 2024-08-31 NOTE — Progress Notes (Signed)
 Remote PPM Transmission

## 2024-09-05 ENCOUNTER — Ambulatory Visit: Payer: Self-pay | Admitting: Cardiovascular Disease

## 2024-09-06 DIAGNOSIS — M79675 Pain in left toe(s): Secondary | ICD-10-CM | POA: Diagnosis not present

## 2024-09-06 DIAGNOSIS — M79674 Pain in right toe(s): Secondary | ICD-10-CM | POA: Diagnosis not present

## 2024-09-06 DIAGNOSIS — B351 Tinea unguium: Secondary | ICD-10-CM | POA: Diagnosis not present

## 2024-09-08 DIAGNOSIS — H52223 Regular astigmatism, bilateral: Secondary | ICD-10-CM | POA: Diagnosis not present

## 2024-09-16 ENCOUNTER — Ambulatory Visit: Payer: Medicare HMO | Attending: Cardiovascular Disease | Admitting: Cardiovascular Disease

## 2024-09-16 ENCOUNTER — Encounter: Payer: Self-pay | Admitting: Cardiovascular Disease

## 2024-09-16 VITALS — BP 138/68 | HR 65 | Ht 71.0 in | Wt 259.0 lb

## 2024-09-16 DIAGNOSIS — I442 Atrioventricular block, complete: Secondary | ICD-10-CM | POA: Diagnosis not present

## 2024-09-16 DIAGNOSIS — I10 Essential (primary) hypertension: Secondary | ICD-10-CM

## 2024-09-16 NOTE — Progress Notes (Signed)
  Electrophysiology Office Note:    Date:  09/16/2024   ID:  Dustin Foster, DOB [birth year], MRN [id number]  PCP:  Sheryle Carwin, MD   [facility] Providers Cardiologist:  None Electrophysiologist:  Eulas FORBES Furbish, MD     Referring MD: Sheryle Carwin, MD   History of Present Illness:    Dustin Foster is a 89 y.o. male with a medical history significant for hypertension, hyperlipidemia, diet-controlled diabetes who presents for electrophysiology follow-up.     He was admitted to [facility] in July 2024 with complete heart block.  A Medtronic dual-chamber pacemaker was placed on May 27, 2023       Today, he has no complaints. he has no device related complaints -- no new tenderness, drainage, redness.   EKGs/Labs/Other Studies Reviewed Today:     Echocardiogram:  TTE July 2024 EF 55 to 60%.  Mildly dilated right atrium.    EKG:   EKG Interpretation Date/Time:  Friday September 16 2024 10:31:37 EST Ventricular Rate:  65 PR Interval:  232 QRS Duration:  134 QT Interval:  418 QTC Calculation: 434 R Axis:   -2  Text Interpretation: Atrial-sensed ventricular-paced rhythm with prolonged AV conduction , occasional A-pacing When compared with ECG of 28-May-2023 04:21, Vent. rate has decreased BY  32 BPM Confirmed by Furbish Eulas [phone]) on 09/16/2024 11:01:19 AM     Physical Exam:    VS:  BP 138/68 (BP Location: Right Arm)   Pulse 65   Ht 5' 11 (1.803 m)   Wt 259 lb (117.5 kg)   SpO2 93%   BMI 36.12 kg/m     Wt Readings from Last 3 Encounters:  09/16/24 259 lb (117.5 kg)  09/18/23 263 lb 12.8 oz (119.7 kg)  05/28/23 250 lb 11.2 oz (113.7 kg)     GEN: Well nourished, well developed in no acute distress CARDIAC: RRR, no murmurs, rubs, gallops The device site is normal -- no tenderness, edema, drainage, redness, threatened erosion.  RESPIRATORY:  Normal work of breathing MUSCULOSKELETAL: no edema    ASSESSMENT & PLAN:     Complete heart  block Medtronic dual-chamber pacemaker in place I reviewed today's device interrogation.  See Paceart for details He is device dependent today  NSVT Device-detected Brief, asymptomatic Normal EF 05/2023 Continue to monitor  Hypertension No changes today   Signed, Eulas FORBES Furbish, MD  09/16/2024 11:01 AM    [facility]

## 2024-09-16 NOTE — Patient Instructions (Addendum)

## 2024-09-17 LAB — CUP PACEART INCLINIC DEVICE CHECK
Brady Statistic RA Percent Paced: 27.7 %
Brady Statistic RV Percent Paced: 99.9 %
Date Time Interrogation Session: 20251107212753
Implantable Lead Connection Status: 753985
Implantable Lead Connection Status: 753985
Implantable Lead Implant Date: 20240717
Implantable Lead Implant Date: 20240717
Implantable Lead Location: 753859
Implantable Lead Location: 753860
Implantable Lead Model: 3830
Implantable Lead Model: 5076
Implantable Pulse Generator Implant Date: 20240717
Lead Channel Impedance Value: 418 Ohm
Lead Channel Impedance Value: 475 Ohm
Lead Channel Pacing Threshold Amplitude: 0.75 V
Lead Channel Pacing Threshold Amplitude: 0.75 V
Lead Channel Pacing Threshold Pulse Width: 0.4 ms
Lead Channel Pacing Threshold Pulse Width: 0.4 ms
Lead Channel Sensing Intrinsic Amplitude: 4 mV

## 2024-10-04 ENCOUNTER — Ambulatory Visit: Payer: Self-pay | Admitting: Cardiovascular Disease

## 2024-11-24 ENCOUNTER — Ambulatory Visit (INDEPENDENT_AMBULATORY_CARE_PROVIDER_SITE_OTHER): Payer: Medicare HMO

## 2024-11-24 DIAGNOSIS — I442 Atrioventricular block, complete: Secondary | ICD-10-CM

## 2024-11-24 LAB — CUP PACEART REMOTE DEVICE CHECK
Battery Remaining Longevity: 133 mo
Battery Voltage: 3.03 V
Brady Statistic AP VP Percent: 35.78 %
Brady Statistic AP VS Percent: 0 %
Brady Statistic AS VP Percent: 64.12 %
Brady Statistic AS VS Percent: 0.1 %
Brady Statistic RA Percent Paced: 35.36 %
Brady Statistic RV Percent Paced: 99.9 %
Date Time Interrogation Session: 20260115062754
Implantable Lead Connection Status: 753985
Implantable Lead Connection Status: 753985
Implantable Lead Implant Date: 20240717
Implantable Lead Implant Date: 20240717
Implantable Lead Location: 753859
Implantable Lead Location: 753860
Implantable Lead Model: 3830
Implantable Lead Model: 5076
Implantable Pulse Generator Implant Date: 20240717
Lead Channel Impedance Value: 304 Ohm
Lead Channel Impedance Value: 380 Ohm
Lead Channel Impedance Value: 399 Ohm
Lead Channel Impedance Value: 494 Ohm
Lead Channel Pacing Threshold Amplitude: 0.625 V
Lead Channel Pacing Threshold Amplitude: 0.75 V
Lead Channel Pacing Threshold Pulse Width: 0.4 ms
Lead Channel Pacing Threshold Pulse Width: 0.4 ms
Lead Channel Sensing Intrinsic Amplitude: 2.25 mV
Lead Channel Sensing Intrinsic Amplitude: 2.25 mV
Lead Channel Sensing Intrinsic Amplitude: 5.25 mV
Lead Channel Sensing Intrinsic Amplitude: 5.25 mV
Lead Channel Setting Pacing Amplitude: 1.5 V
Lead Channel Setting Pacing Amplitude: 2 V
Lead Channel Setting Pacing Pulse Width: 0.4 ms
Lead Channel Setting Sensing Sensitivity: 1.2 mV
Zone Setting Status: 755011

## 2024-11-25 ENCOUNTER — Ambulatory Visit: Payer: Self-pay | Admitting: Cardiovascular Disease

## 2024-12-01 NOTE — Progress Notes (Signed)
 Remote PPM Transmission

## 2025-05-16 ENCOUNTER — Ambulatory Visit: Admitting: Urology
